# Patient Record
Sex: Female | Born: 1950
Health system: Southern US, Community
[De-identification: ages and names within clinical notes are randomized; demographics above are authoritative.]

## PROBLEM LIST (undated history)

## (undated) DIAGNOSIS — K219 Gastro-esophageal reflux disease without esophagitis: Secondary | ICD-10-CM

## (undated) DIAGNOSIS — H9319 Tinnitus, unspecified ear: Secondary | ICD-10-CM

## (undated) DIAGNOSIS — I10 Essential (primary) hypertension: Secondary | ICD-10-CM

## (undated) DIAGNOSIS — M25552 Pain in left hip: Secondary | ICD-10-CM

## (undated) DIAGNOSIS — D72819 Decreased white blood cell count, unspecified: Secondary | ICD-10-CM

## (undated) DIAGNOSIS — M48061 Spinal stenosis, lumbar region without neurogenic claudication: Secondary | ICD-10-CM

## (undated) DIAGNOSIS — E559 Vitamin D deficiency, unspecified: Secondary | ICD-10-CM

## (undated) DIAGNOSIS — M5430 Sciatica, unspecified side: Secondary | ICD-10-CM

## (undated) DIAGNOSIS — E785 Hyperlipidemia, unspecified: Secondary | ICD-10-CM

## (undated) DIAGNOSIS — Z860100 Personal history of colon polyps, unspecified: Secondary | ICD-10-CM

## (undated) DIAGNOSIS — M545 Low back pain, unspecified: Secondary | ICD-10-CM

## (undated) DIAGNOSIS — R32 Unspecified urinary incontinence: Secondary | ICD-10-CM

## (undated) DIAGNOSIS — N898 Other specified noninflammatory disorders of vagina: Secondary | ICD-10-CM

## (undated) DIAGNOSIS — M4310 Spondylolisthesis, site unspecified: Secondary | ICD-10-CM

## (undated) HISTORY — DX: Spondylolisthesis, site unspecified: M43.10

## (undated) HISTORY — DX: Pain in left hip: M25.552

## (undated) HISTORY — DX: Spinal stenosis, lumbar region without neurogenic claudication: M48.061

## (undated) HISTORY — DX: Other specified noninflammatory disorders of vagina: N89.8

## (undated) HISTORY — PX: BREAST EXCISIONAL BIOPSY: SUR124

## (undated) HISTORY — DX: Essential (primary) hypertension: I10

## (undated) HISTORY — DX: Personal history of colon polyps, unspecified: Z86.0100

## (undated) HISTORY — PX: BREAST BIOPSY: SHX20

## (undated) HISTORY — DX: Hyperlipidemia, unspecified: E78.5

## (undated) HISTORY — DX: Low back pain, unspecified: M54.50

## (undated) HISTORY — DX: Vitamin D deficiency, unspecified: E55.9

## (undated) HISTORY — DX: Tinnitus, unspecified ear: H93.19

## (undated) HISTORY — DX: Sciatica, unspecified side: M54.30

## (undated) HISTORY — DX: Gastro-esophageal reflux disease without esophagitis: K21.9

## (undated) HISTORY — DX: Decreased white blood cell count, unspecified: D72.819

## (undated) HISTORY — DX: Unspecified urinary incontinence: R32

---

## 1988-11-30 HISTORY — PX: ABDOMINAL HYSTERECTOMY: SHX81

## 2015-11-07 DIAGNOSIS — I10 Essential (primary) hypertension: Secondary | ICD-10-CM | POA: Diagnosis not present

## 2015-11-07 DIAGNOSIS — R5383 Other fatigue: Secondary | ICD-10-CM | POA: Diagnosis not present

## 2015-11-07 DIAGNOSIS — E782 Mixed hyperlipidemia: Secondary | ICD-10-CM | POA: Diagnosis not present

## 2015-12-25 DIAGNOSIS — L570 Actinic keratosis: Secondary | ICD-10-CM | POA: Diagnosis not present

## 2015-12-25 DIAGNOSIS — Z85828 Personal history of other malignant neoplasm of skin: Secondary | ICD-10-CM | POA: Diagnosis not present

## 2015-12-25 DIAGNOSIS — D485 Neoplasm of uncertain behavior of skin: Secondary | ICD-10-CM | POA: Diagnosis not present

## 2016-03-30 DIAGNOSIS — J00 Acute nasopharyngitis [common cold]: Secondary | ICD-10-CM | POA: Diagnosis not present

## 2016-05-13 DIAGNOSIS — R5383 Other fatigue: Secondary | ICD-10-CM | POA: Diagnosis not present

## 2016-05-13 DIAGNOSIS — Z79899 Other long term (current) drug therapy: Secondary | ICD-10-CM | POA: Diagnosis not present

## 2016-05-13 DIAGNOSIS — E782 Mixed hyperlipidemia: Secondary | ICD-10-CM | POA: Diagnosis not present

## 2016-05-13 DIAGNOSIS — I10 Essential (primary) hypertension: Secondary | ICD-10-CM | POA: Diagnosis not present

## 2016-05-18 DIAGNOSIS — I10 Essential (primary) hypertension: Secondary | ICD-10-CM | POA: Diagnosis not present

## 2016-05-18 DIAGNOSIS — K219 Gastro-esophageal reflux disease without esophagitis: Secondary | ICD-10-CM | POA: Diagnosis not present

## 2016-05-18 DIAGNOSIS — Z1389 Encounter for screening for other disorder: Secondary | ICD-10-CM | POA: Diagnosis not present

## 2016-05-18 DIAGNOSIS — Z79899 Other long term (current) drug therapy: Secondary | ICD-10-CM | POA: Diagnosis not present

## 2016-05-18 DIAGNOSIS — E782 Mixed hyperlipidemia: Secondary | ICD-10-CM | POA: Diagnosis not present

## 2016-05-18 DIAGNOSIS — M810 Age-related osteoporosis without current pathological fracture: Secondary | ICD-10-CM | POA: Diagnosis not present

## 2016-05-18 DIAGNOSIS — Z0000 Encounter for general adult medical examination without abnormal findings: Secondary | ICD-10-CM | POA: Diagnosis not present

## 2016-06-05 DIAGNOSIS — H1045 Other chronic allergic conjunctivitis: Secondary | ICD-10-CM | POA: Diagnosis not present

## 2016-06-19 DIAGNOSIS — K753 Granulomatous hepatitis, not elsewhere classified: Secondary | ICD-10-CM | POA: Diagnosis not present

## 2016-06-19 DIAGNOSIS — M47896 Other spondylosis, lumbar region: Secondary | ICD-10-CM | POA: Diagnosis not present

## 2016-06-19 DIAGNOSIS — M5136 Other intervertebral disc degeneration, lumbar region: Secondary | ICD-10-CM | POA: Diagnosis not present

## 2016-06-19 DIAGNOSIS — I700 Atherosclerosis of aorta: Secondary | ICD-10-CM | POA: Diagnosis not present

## 2016-06-19 DIAGNOSIS — M545 Low back pain: Secondary | ICD-10-CM | POA: Diagnosis not present

## 2016-07-08 DIAGNOSIS — Z124 Encounter for screening for malignant neoplasm of cervix: Secondary | ICD-10-CM | POA: Diagnosis not present

## 2016-07-08 DIAGNOSIS — Z01419 Encounter for gynecological examination (general) (routine) without abnormal findings: Secondary | ICD-10-CM | POA: Diagnosis not present

## 2016-07-08 DIAGNOSIS — Z79899 Other long term (current) drug therapy: Secondary | ICD-10-CM | POA: Diagnosis not present

## 2016-07-08 DIAGNOSIS — I10 Essential (primary) hypertension: Secondary | ICD-10-CM | POA: Diagnosis not present

## 2016-07-10 DIAGNOSIS — H04123 Dry eye syndrome of bilateral lacrimal glands: Secondary | ICD-10-CM | POA: Diagnosis not present

## 2016-07-10 DIAGNOSIS — Z1231 Encounter for screening mammogram for malignant neoplasm of breast: Secondary | ICD-10-CM | POA: Diagnosis not present

## 2016-07-10 DIAGNOSIS — Z780 Asymptomatic menopausal state: Secondary | ICD-10-CM | POA: Diagnosis not present

## 2016-07-10 DIAGNOSIS — M8588 Other specified disorders of bone density and structure, other site: Secondary | ICD-10-CM | POA: Diagnosis not present

## 2016-10-14 DIAGNOSIS — Z23 Encounter for immunization: Secondary | ICD-10-CM | POA: Diagnosis not present

## 2016-11-03 DIAGNOSIS — D72819 Decreased white blood cell count, unspecified: Secondary | ICD-10-CM | POA: Diagnosis not present

## 2016-11-03 DIAGNOSIS — M545 Low back pain: Secondary | ICD-10-CM | POA: Diagnosis not present

## 2016-11-03 DIAGNOSIS — I10 Essential (primary) hypertension: Secondary | ICD-10-CM | POA: Diagnosis not present

## 2016-11-03 DIAGNOSIS — K219 Gastro-esophageal reflux disease without esophagitis: Secondary | ICD-10-CM | POA: Diagnosis not present

## 2016-11-03 DIAGNOSIS — E785 Hyperlipidemia, unspecified: Secondary | ICD-10-CM | POA: Diagnosis not present

## 2016-11-17 DIAGNOSIS — D72819 Decreased white blood cell count, unspecified: Secondary | ICD-10-CM | POA: Diagnosis not present

## 2016-11-17 DIAGNOSIS — E785 Hyperlipidemia, unspecified: Secondary | ICD-10-CM | POA: Diagnosis not present

## 2017-01-29 DIAGNOSIS — E785 Hyperlipidemia, unspecified: Secondary | ICD-10-CM | POA: Diagnosis not present

## 2017-01-29 DIAGNOSIS — M5410 Radiculopathy, site unspecified: Secondary | ICD-10-CM | POA: Diagnosis not present

## 2017-01-29 DIAGNOSIS — M549 Dorsalgia, unspecified: Secondary | ICD-10-CM | POA: Diagnosis not present

## 2017-01-29 DIAGNOSIS — D72819 Decreased white blood cell count, unspecified: Secondary | ICD-10-CM | POA: Diagnosis not present

## 2017-02-01 ENCOUNTER — Other Ambulatory Visit: Payer: Self-pay | Admitting: Family Medicine

## 2017-02-01 DIAGNOSIS — M5410 Radiculopathy, site unspecified: Secondary | ICD-10-CM

## 2017-02-21 ENCOUNTER — Ambulatory Visit
Admission: RE | Admit: 2017-02-21 | Discharge: 2017-02-21 | Disposition: A | Discharge status: Home / Self Care | Payer: Medicare Other | Source: Ambulatory Visit | Attending: Family Medicine | Admitting: Family Medicine

## 2017-02-21 DIAGNOSIS — M48061 Spinal stenosis, lumbar region without neurogenic claudication: Secondary | ICD-10-CM | POA: Diagnosis not present

## 2017-02-21 DIAGNOSIS — M5410 Radiculopathy, site unspecified: Secondary | ICD-10-CM

## 2017-03-09 DIAGNOSIS — Z6826 Body mass index (BMI) 26.0-26.9, adult: Secondary | ICD-10-CM | POA: Diagnosis not present

## 2017-03-09 DIAGNOSIS — I10 Essential (primary) hypertension: Secondary | ICD-10-CM | POA: Diagnosis not present

## 2017-03-09 DIAGNOSIS — M48061 Spinal stenosis, lumbar region without neurogenic claudication: Secondary | ICD-10-CM | POA: Diagnosis not present

## 2017-03-09 DIAGNOSIS — M4317 Spondylolisthesis, lumbosacral region: Secondary | ICD-10-CM | POA: Diagnosis not present

## 2017-03-09 DIAGNOSIS — M4316 Spondylolisthesis, lumbar region: Secondary | ICD-10-CM | POA: Diagnosis not present

## 2017-05-04 DIAGNOSIS — I10 Essential (primary) hypertension: Secondary | ICD-10-CM | POA: Diagnosis not present

## 2017-05-04 DIAGNOSIS — D72819 Decreased white blood cell count, unspecified: Secondary | ICD-10-CM | POA: Diagnosis not present

## 2017-05-04 DIAGNOSIS — Z0000 Encounter for general adult medical examination without abnormal findings: Secondary | ICD-10-CM | POA: Diagnosis not present

## 2017-05-04 DIAGNOSIS — E785 Hyperlipidemia, unspecified: Secondary | ICD-10-CM | POA: Diagnosis not present

## 2017-05-04 DIAGNOSIS — Z23 Encounter for immunization: Secondary | ICD-10-CM | POA: Diagnosis not present

## 2017-05-31 DIAGNOSIS — M48061 Spinal stenosis, lumbar region without neurogenic claudication: Secondary | ICD-10-CM | POA: Diagnosis not present

## 2017-05-31 DIAGNOSIS — M4316 Spondylolisthesis, lumbar region: Secondary | ICD-10-CM | POA: Diagnosis not present

## 2017-06-22 ENCOUNTER — Encounter: Payer: Self-pay | Admitting: Physical Therapy

## 2017-06-22 ENCOUNTER — Ambulatory Visit: Payer: Medicare Other | Attending: Family Medicine | Admitting: Physical Therapy

## 2017-06-22 DIAGNOSIS — M5442 Lumbago with sciatica, left side: Secondary | ICD-10-CM | POA: Diagnosis not present

## 2017-06-22 DIAGNOSIS — G8929 Other chronic pain: Secondary | ICD-10-CM | POA: Diagnosis not present

## 2017-06-22 DIAGNOSIS — M6281 Muscle weakness (generalized): Secondary | ICD-10-CM | POA: Insufficient documentation

## 2017-06-22 NOTE — Patient Instructions (Addendum)
Chair Sitting    Sit at edge of seat, spine straight, one leg extended. Put a hand on each thigh and bend forward from the hip, keeping spine straight. Allow hand on extended leg to reach toward toes. Support upper body with other arm. Hold _30__ seconds. Repeat _2__ times per session. Do _1__ sessions per day.  Copyright  VHI. All rights reserved.  Piriformis Stretch, Sitting    Sit, one ankle on opposite knee, same-side hand on crossed knee. Push down on knee, keeping spine straight. Lean torso forward, with flat back, until tension is felt in hamstrings and gluteals of crossed-leg side. Hold _30__ seconds.  Repeat _2__ times per session. Do _1__ sessions per day.  Copyright  VHI. All rights reserved.  Posture - Sitting    Sit upright, head facing forward. Try using a roll to support lower back. Keep shoulders relaxed, and avoid rounded back. Keep hips level with knees. Avoid crossing legs for long periods.   Copyright  VHI. All rights reserved.    Posture - Standing    Good posture is important. Avoid slouching and forward head thrust. Maintain curve in low back and align ears over shoul- ders, hips over ankles.   Copyright  VHI. All rights reserved.   Housework - Vacuuming    Hold the vacuum with arm held at side. Step back and forth to move it, keeping head up. Avoid twisting.   Copyright  VHI. All rights reserved.  [place] [address] [place],  [address] Phone # [phone] Fax [phone]

## 2017-06-22 NOTE — Therapy (Signed)
[facility] [address] [place], [place], [address] Phone: [phone]   Fax:  [phone]  Physical Therapy Evaluation  Patient Details  Name: Lauren Martinez MRN: [id number] Date of Birth: 10-27-1951 Referring Provider: Dr. London Pepper  Encounter Date: 06/22/2017      PT End of Session - 06/22/17 0842    Visit Number 1   Number of Visits 10   Date for PT Re-Evaluation 08/17/17   Authorization Type medicare g-code 10th visit   PT Start Time 0800   PT Stop Time 0843   PT Time Calculation (min) 43 min   Activity Tolerance Patient tolerated treatment well   Behavior During Therapy [facility] for tasks assessed/performed      Past Medical History:  Diagnosis Date  . GERD (gastroesophageal reflux disease)   . Hyperlipidemia   . Hypertension   . Spinal stenosis at L4-L5 level   . Spondylisthesis     Past Surgical History:  Procedure Laterality Date  . ABDOMINAL HYSTERECTOMY  1990    There were no vitals filed for this visit.       Subjective Assessment - 06/22/17 0817    Subjective Patient reports she retired 05/2015.  Patient was packing to move and started to have back pain.  Pain would not get better. Back pain has become chronic and gradually become worse.    Limitations Standing;Walking   Patient Stated Goals be painfree or less pain with activitiy   Currently in Pain? Yes   Pain Score 4   worse is 10/10   Pain Location Back  to left hip   Pain Orientation Left   Pain Descriptors / Indicators Aching;Dull;Shooting;Sharp   Pain Type Chronic pain   Pain Radiating Towards to left hip   Pain Onset More than a month ago   Pain Frequency Intermittent   Aggravating Factors  walking,standing, vacuuming   Pain Relieving Factors sitting            [facility] PT Assessment - 06/22/17 0001      Assessment   Medical Diagnosis M43.10 spondylisthesis; M48.061 spinal stenosis at l4-l5 level   Referring Provider  Dr. London Pepper   Onset Date/Surgical Date 11/30/14   Prior Therapy none     Precautions   Precautions None     Restrictions   Weight Bearing Restrictions No     Balance Screen   Has the patient fallen in the past 6 months No   Has the patient had a decrease in activity level because of a fear of falling?  No   Is the patient reluctant to leave their home because of a fear of falling?  No     Home Ec[employer] residence     Prior Function   Level of Independence Independent   Vocation Retired   Leisure walk, gym 3x/week,      Cognition   Overall Cognitive Status Within Functional Limits for tasks assessed     Observation/Other Assessments   Focus on Therapeutic Outcomes (FOTO)  30% limitation  goal is 27% limitation     Posture/Postural Control   Posture/Postural Control No significant limitations     ROM / Strength   AROM / PROM / Strength AROM;PROM;Strength     AROM   Overall AROM Comments lumbar ROM is full     PROM   Right Hip External Rotation  45   Left Hip External Rotation  45  Strength   Right Hip Flexion 3+/5   Right Hip External Rotation  4/5   Right Hip ABduction 4/5   Left Hip Flexion 3+/5   Left Hip External Rotation 4/5   Left Hip ABduction 4/5     Palpation   Palpation comment tightness located in left lumbar paraspinals     Transfers   Transfers Not assessed     Ambulation/Gait   Ambulation/Gait No            Objective measurements completed on examination: See above findings.                    PT Short Term Goals - 06/22/17 0843      PT SHORT TERM GOAL #1   Title independent with initial HEP   Time 4   Period Weeks   Status New   Target Date 07/20/17     PT SHORT TERM GOAL #2   Title demonstrate correct body mechanics with daily activities, lifting, standing, and sitting   Time 4   Period Weeks   Status New   Target Date 07/20/17           PT Long Term Goals -  06/22/17 0838      PT LONG TERM GOAL #1   Title independent with HEP   Time 8   Period Weeks   Status New   Target Date 08/17/17     PT LONG TERM GOAL #2   Title vacuum with correct body mechanics or other types of housework in standing with minimal to no pain   Time 8   Period Weeks   Status New   Target Date 08/17/17     PT LONG TERM GOAL #3   Title walking for for 2 or more miles with minimal to no pain due to increased core and hip strength   Time 8   Period Weeks   Status New   Target Date 08/17/17     PT LONG TERM GOAL #4   Title sitting in a car for 1.5 hours with minimal to no pain with good back support   Time 8   Period Weeks   Status New   Target Date 08/17/17     PT LONG TERM GOAL #5   Title FOTO score </= 27% limitation   Time 8   Period Weeks   Status New   Target Date 08/17/17                Plan - 06/22/17 0951    Clinical Impression Statement Patient is a 66 year old female with chronic back pain when she started to pain her home 11/2014.  Patient reports her pain has become worse over time and is intermittent.  Pain in low back to right hip is 4-10/10.  Patient has full lumbar ROM.  Decreased bilateral hip ER PROM.  Decreased core and bilateral hip strength.  Patient has increase pain with standing, walking, standing household chores.  Patient will benefit from skilled therapy to reduce back pain and improve strength to return to prior functional level.    History and Personal Factors relevant to plan of care: spondylisthesis; spinal stenosis at L4-L5 level   Clinical Presentation Stable   Clinical Presentation due to: stable condition   Clinical Decision Making Low   Rehab Potential Excellent   PT Frequency 2x / week   PT Duration 8 weeks   PT Treatment/Interventions Cryotherapy;Electrical Stimulation;Moist Heat;Traction;Ultrasound;Patient/family education;Neuromuscular re-education;Therapeutic exercise;Therapeutic activities;Manual  techniques;Passive range of motion;Dry needling;Taping   PT Next Visit Plan core strength; Hip ER ROM; hip strength; body mechanics   PT Home Exercise Plan body mechanics   Consulted and Agree with Plan of Care Patient      Patient will benefit from skilled therapeutic intervention in order to improve the following deficits and impairments:  Increased fascial restricitons, Decreased range of motion, Increased muscle spasms, Pain, Decreased activity tolerance, Decreased mobility, Decreased strength, Impaired flexibility  Visit Diagnosis: Chronic left-sided low back pain with left-sided sciatica - Plan: PT plan of care cert/re-cert  Muscle weakness (generalized) - Plan: PT plan of care cert/re-cert      G-Codes - 96/78/93 0845    Functional Assessment Tool Used (Outpatient Only) foto score is 30% limitation  goal is 27% limitation   Functional Limitation Mobility: Walking and moving around   Mobility: Walking and Moving Around Current Status (Y1017) At least 20 percent but less than 40 percent impaired, limited or restricted   Mobility: Walking and Moving Around Goal Status (P1025) At least 20 percent but less than 40 percent impaired, limited or restricted       Problem List There are no active problems to display for this patient.   Earlie Counts, PT 06/22/17 9:58 AM    [facility] [address] [place], [place], [address] Phone: [phone]   Fax:  [phone]  Name: Lauren Martinez MRN: [id number] Date of Birth: 14-Jun-1951

## 2017-06-28 ENCOUNTER — Encounter: Payer: Self-pay | Admitting: Physical Therapy

## 2017-06-28 ENCOUNTER — Ambulatory Visit: Payer: Medicare Other | Admitting: Physical Therapy

## 2017-06-28 DIAGNOSIS — G8929 Other chronic pain: Secondary | ICD-10-CM | POA: Diagnosis not present

## 2017-06-28 DIAGNOSIS — M5442 Lumbago with sciatica, left side: Principal | ICD-10-CM

## 2017-06-28 DIAGNOSIS — M6281 Muscle weakness (generalized): Secondary | ICD-10-CM | POA: Diagnosis not present

## 2017-06-28 NOTE — Therapy (Signed)
[facility] [address], [place], [place], [address] Phone: [phone]   Fax:  [phone]  Physical Therapy Treatment  Patient Details  Name: Lauren Martinez MRN: [id number] Date of Birth: Nov 19, 1951 Referring Provider: Dr. London Pepper  Encounter Date: 06/28/2017      PT End of Session - 06/28/17 1624    Visit Number 2   Number of Visits 10   Date for PT Re-Evaluation 08/17/17   Authorization Type medicare g-code 10th visit   PT Start Time 1616   PT Stop Time 1658   PT Time Calculation (min) 42 min   Activity Tolerance Patient tolerated treatment well   Behavior During Therapy [facility] for tasks assessed/performed      Past Medical History:  Diagnosis Date  . GERD (gastroesophageal reflux disease)   . Hyperlipidemia   . Hypertension   . Spinal stenosis at L4-L5 level   . Spondylisthesis     Past Surgical History:  Procedure Laterality Date  . ABDOMINAL HYSTERECTOMY  1990    There were no vitals filed for this visit.      Subjective Assessment - 06/28/17 1622    Subjective Today I have felt pretty good.  I rode the bike at the gym.  I didn't do a lot over the weekend.  If I walk a lot or on my feet a lot, it hurts worse.   Limitations Standing;Walking   Patient Stated Goals be painfree or less pain with activitiy   Currently in Pain? Yes   Pain Score 2    Pain Location Back   Pain Orientation Left   Pain Descriptors / Indicators Aching;Dull   Pain Type Chronic pain   Pain Radiating Towards to the hip and right side of the back   Pain Onset More than a month ago   Pain Frequency Intermittent   Aggravating Factors  walking, standing   Pain Relieving Factors sitting   Multiple Pain Sites No                         [facility] Adult PT Treatment/Exercise - 06/28/17 0001      Exercises   Exercises Lumbar     Lumbar Exercises: Stretches   Single Knee to Chest Stretch 3 reps;20 seconds    Pelvic Tilt 5 reps;10 seconds   Piriformis Stretch 3 reps;20 seconds   Piriformis Stretch Limitations IR hip stretch in supine - 3 x 20 sec     Lumbar Exercises: Aerobic   Stationary Bike Nu-step L1; 6 min     Lumbar Exercises: Supine   Ab Set 5 reps;5 seconds   Bent Knee Raise 10 reps   Bridge 15 reps   Bridge Limitations mini bridge with pelvic tilt   Large Ball Abdominal Isometric 10 reps  red ball roll out and back                PT Education - 06/28/17 1658    Education provided Yes   Education Details pelvic tilt, marching, clam   Person(s) Educated Patient   Methods Explanation;Demonstration;Handout   Comprehension Verbalized understanding;Returned demonstration          PT Short Term Goals - 06/28/17 1706      PT SHORT TERM GOAL #1   Title independent with initial HEP   Time 4   Period Weeks   Status On-going     PT SHORT TERM GOAL #2   Title demonstrate  correct body mechanics with daily activities, lifting, standing, and sitting   Time 4   Period Weeks   Status On-going           PT Long Term Goals - 06/22/17 1856      PT LONG TERM GOAL #1   Title independent with HEP   Time 8   Period Weeks   Status New   Target Date 08/17/17     PT LONG TERM GOAL #2   Title vacuum with correct body mechanics or other types of housework in standing with minimal to no pain   Time 8   Period Weeks   Status New   Target Date 08/17/17     PT LONG TERM GOAL #3   Title walking for for 2 or more miles with minimal to no pain due to increased core and hip strength   Time 8   Period Weeks   Status New   Target Date 08/17/17     PT LONG TERM GOAL #4   Title sitting in a car for 1.5 hours with minimal to no pain with good back support   Time 8   Period Weeks   Status New   Target Date 08/17/17     PT LONG TERM GOAL #5   Title FOTO score </= 27% limitation   Time 8   Period Weeks   Status New   Target Date 08/17/17                Plan - 06/28/17 1626    Clinical Impression Statement Pt states she is conscious of her posture and understands the posture education.  Pt able begin core strengthening today.  She has difficulty not holding her breath during the exercises.  She tolerated exercises well without increased pain.   PT Treatment/Interventions Cryotherapy;Electrical Stimulation;Moist Heat;Traction;Ultrasound;Patient/family education;Neuromuscular re-education;Therapeutic exercise;Therapeutic activities;Manual techniques;Passive range of motion;Dry needling;Taping   PT Next Visit Plan core strength; Hip ER ROM; hip strength; body mechanics   Consulted and Agree with Plan of Care Patient      Patient will benefit from skilled therapeutic intervention in order to improve the following deficits and impairments:  Increased fascial restricitons, Decreased range of motion, Increased muscle spasms, Pain, Decreased activity tolerance, Decreased mobility, Decreased strength, Impaired flexibility  Visit Diagnosis: Chronic left-sided low back pain with left-sided sciatica  Muscle weakness (generalized)     Problem List There are no active problems to display for this patient.   Zannie Cove, PT 06/28/2017, 5:12 PM  [facility] [address], [place], [place], [address] Phone: [phone]   Fax:  [phone]  Name: Lauren Martinez MRN: [id number] Date of Birth: 04-27-1951

## 2017-06-28 NOTE — Patient Instructions (Signed)
   PELVIC TILT - SUPINE  Lie on your back with your knees bent. Next, arch your low back and then flatten it repeatedly. Your pelvis should tilt forward and back during the movement. Move through a comfortable range of motion. 20x      Transverse Abdominus Activation  Contract your lower abdominals as if you were trying to lift one leg from the table.  Initiate the movement but do no lift foot greater than 1 inch from the table.  Repeat opposite side.  20x each side    CLAM SHELLS  While lying on your side with your knees bent, draw up the top knee while keeping contact of your feet together.  Do not let your pelvis roll back during the lifting movement.  Repeat 20x

## 2017-06-30 ENCOUNTER — Ambulatory Visit: Payer: Medicare Other | Attending: Family Medicine

## 2017-06-30 DIAGNOSIS — M6281 Muscle weakness (generalized): Secondary | ICD-10-CM | POA: Insufficient documentation

## 2017-06-30 DIAGNOSIS — M5136 Other intervertebral disc degeneration, lumbar region: Secondary | ICD-10-CM | POA: Diagnosis not present

## 2017-06-30 DIAGNOSIS — M5442 Lumbago with sciatica, left side: Secondary | ICD-10-CM | POA: Insufficient documentation

## 2017-06-30 DIAGNOSIS — M47817 Spondylosis without myelopathy or radiculopathy, lumbosacral region: Secondary | ICD-10-CM | POA: Diagnosis not present

## 2017-06-30 DIAGNOSIS — G8929 Other chronic pain: Secondary | ICD-10-CM | POA: Insufficient documentation

## 2017-06-30 DIAGNOSIS — M79652 Pain in left thigh: Secondary | ICD-10-CM | POA: Diagnosis not present

## 2017-06-30 DIAGNOSIS — M4696 Unspecified inflammatory spondylopathy, lumbar region: Secondary | ICD-10-CM | POA: Diagnosis not present

## 2017-06-30 NOTE — Therapy (Signed)
[facility] [address] [place], [place], [address] Phone: [phone]   Fax:  [phone]  Physical Therapy Treatment  Patient Details  Name: Cozetta Seif MRN: [id number] Date of Birth: 1951/09/09 Referring Provider: Dr. London Pepper  Encounter Date: 06/30/2017      PT End of Session - 06/30/17 1654    Visit Number 3   Number of Visits 10   Date for PT Re-Evaluation 08/17/17   Authorization Type medicare g-code 10th visit   PT Start Time 1613   PT Stop Time 1654   PT Time Calculation (min) 41 min   Activity Tolerance Patient tolerated treatment well   Behavior During Therapy [facility] for tasks assessed/performed      Past Medical History:  Diagnosis Date  . GERD (gastroesophageal reflux disease)   . Hyperlipidemia   . Hypertension   . Spinal stenosis at L4-L5 level   . Spondylisthesis     Past Surgical History:  Procedure Laterality Date  . ABDOMINAL HYSTERECTOMY  1990    There were no vitals filed for this visit.      Subjective Assessment - 06/30/17 1617    Subjective Pt reports some lumbar soreness after being busy today.     Currently in Pain? Yes   Pain Score 3    Pain Location Back   Pain Orientation Left   Pain Descriptors / Indicators Aching;Dull   Pain Type Chronic pain                         [facility] Adult PT Treatment/Exercise - 06/30/17 0001      Exercises   Exercises Knee/Hip     Lumbar Exercises: Stretches   Active Hamstring Stretch 3 reps;20 seconds   Single Knee to Chest Stretch 3 reps;20 seconds   Pelvic Tilt 5 reps;10 seconds   Piriformis Stretch 3 reps;20 seconds   Piriformis Stretch Limitations IR hip stretch in supine - 3 x 20 sec     Lumbar Exercises: Aerobic   Stationary Bike Nu-step L2x  6 min     Lumbar Exercises: Supine   Ab Set 5 reps;5 seconds   Bridge 15 reps   Bridge Limitations mini bridge with pelvic tilt     Lumbar Exercises: Sidelying    Clam 20 reps     Knee/Hip Exercises: Stretches   Hip Flexor Stretch 3 reps;20 seconds                PT Education - 06/30/17 1632    Education provided Yes   Education Details body mechanics   Person(s) Educated Patient   Methods Explanation;Handout;Demonstration   Comprehension Verbalized understanding;Returned demonstration          PT Short Term Goals - 06/30/17 1618      PT SHORT TERM GOAL #1   Title independent with initial HEP   Status Achieved     PT SHORT TERM GOAL #2   Title demonstrate correct body mechanics with daily activities, lifting, standing, and sitting   Time 4   Period Weeks   Status On-going           PT Long Term Goals - 06/30/17 1619      PT LONG TERM GOAL #1   Title independent with HEP   Time 8   Period Weeks   Status On-going               Plan - 06/30/17 1623  Clinical Impression Statement Pt is independent and compliant in HEP for flexibility and strength.  No significant change in pain since the start of care.  Pt able to perform abdominal bracing without holding her breath today.  Pt without increased pain today with exercise.  Pt will continue to benefit from skilled PT for core strength, endurance and flexibility.     Rehab Potential Excellent   PT Frequency 2x / week   PT Duration 8 weeks   PT Treatment/Interventions Cryotherapy;Electrical Stimulation;Moist Heat;Traction;Ultrasound;Patient/family education;Neuromuscular re-education;Therapeutic exercise;Therapeutic activities;Manual techniques;Passive range of motion;Dry needling;Taping   PT Next Visit Plan core strength; Hip ER ROM; hip strength   Consulted and Agree with Plan of Care Patient      Patient will benefit from skilled therapeutic intervention in order to improve the following deficits and impairments:  Increased fascial restricitons, Decreased range of motion, Increased muscle spasms, Pain, Decreased activity tolerance, Decreased mobility,  Decreased strength, Impaired flexibility  Visit Diagnosis: Chronic left-sided low back pain with left-sided sciatica  Muscle weakness (generalized)     Problem List There are no active problems to display for this patient.   Sigurd Sos, PT 06/30/17 4:55 PM  [facility] [address], [place], [place], [address] Phone: [phone]   Fax:  [phone]  Name: Lanie Schelling MRN: [id number] Date of Birth: 24-Jul-1951

## 2017-06-30 NOTE — Patient Instructions (Addendum)
   Lifting Principles  Maintain proper posture and head alignment. Slide object as close as possible before lifting. Move obstacles out of the way. Test before lifting; ask for help if too heavy. Tighten stomach muscles without holding breath. Use smooth movements; do not jerk. Use legs to do the work, and pivot with feet. Distribute the work load symmetrically and close to the center of trunk. Push instead of pull whenever possible.   Squat down and hold basket close to stand. Use leg muscles to do the work.    Avoid twisting or bending back. Pivot around using foot movements, and bend at knees if needed when reaching for articles.        Getting Into / Out of Bed   Lower self to lie down on one side by raising legs and lowering head at the same time. Use arms to assist moving without twisting. Bend both knees to roll onto back if desired. To sit up, start from lying on side, and use same move-ments in reverse. Keep trunk aligned with legs.    Shift weight from front foot to back foot as item is lifted off shelf.    When leaning forward to pick object up from floor, extend one leg out behind. Keep back straight. Hold onto a sturdy support with other hand.      Sit upright, head facing forward. Try using a roll to support lower back. Keep shoulders relaxed, and avoid rounded back. Keep hips level with knees. Avoid crossing legs for long periods.     [facility] [address] [place], [place] [address] Phone # [phone] Fax [phone]  

## 2017-07-06 ENCOUNTER — Ambulatory Visit: Payer: Medicare Other | Admitting: Rehabilitation

## 2017-07-06 ENCOUNTER — Encounter: Payer: Self-pay | Admitting: Rehabilitation

## 2017-07-06 DIAGNOSIS — M5442 Lumbago with sciatica, left side: Secondary | ICD-10-CM | POA: Diagnosis not present

## 2017-07-06 DIAGNOSIS — G8929 Other chronic pain: Secondary | ICD-10-CM

## 2017-07-06 DIAGNOSIS — M6281 Muscle weakness (generalized): Secondary | ICD-10-CM | POA: Diagnosis not present

## 2017-07-06 NOTE — Therapy (Signed)
[facility] [address] [place], [place], [address] Phone: [phone]   Fax:  [phone]  Physical Therapy Treatment  Patient Details  Name: Lauren Martinez MRN: [id number] Date of Birth: 28-Sep-1951 Referring Provider: Dr. London Pepper  Encounter Date: 07/06/2017      PT End of Session - 07/06/17 1047    Visit Number 4   Number of Visits 10   Date for PT Re-Evaluation 08/17/17   Authorization Type medicare g-code 10th visit   PT Start Time 1015   PT Stop Time 1100   PT Time Calculation (min) 45 min   Activity Tolerance Patient tolerated treatment well      Past Medical History:  Diagnosis Date  . GERD (gastroesophageal reflux disease)   . Hyperlipidemia   . Hypertension   . Spinal stenosis at L4-L5 level   . Spondylisthesis     Past Surgical History:  Procedure Laterality Date  . ABDOMINAL HYSTERECTOMY  1990    There were no vitals filed for this visit.      Subjective Assessment - 07/06/17 1016    Subjective no better and no worse. Went to the pain management MD and will be starting lumbar injection tomorrow    Diagnostic tests "pinching of the 4th and 5th nerves in the lumbar spine due to OA" per pt   Currently in Pain? Yes   Pain Score 3    Pain Location Back   Pain Orientation Left   Pain Descriptors / Indicators Aching;Dull            [facility] PT Assessment - 07/06/17 0001      AROM   Overall AROM Comments lumbar AROM WNL today but increased pain with extension                     [facility] Adult PT Treatment/Exercise - 07/06/17 0001      Lumbar Exercises: Stretches   Passive Hamstring Stretch 3 reps;20 seconds   Passive Hamstring Stretch Limitations with green strap   Pelvic Tilt 5 reps;10 seconds   Piriformis Stretch 3 reps;20 seconds  thread the needle position     Lumbar Exercises: Aerobic   Stationary Bike level 2x50min     Lumbar Exercises: Supine   Ab Set 5  reps  10seconds   AB Set Limitations trying various cueing without palpable contraction   Bridge 15 reps   Bridge Limitations mini bridge with pelvic tilt   Large Ball Abdominal Isometric 10 reps   Large Ball Abdominal Isometric Limitations alternating LE lifts x 10 bil                  PT Short Term Goals - 07/06/17 1051      PT SHORT TERM GOAL #1   Title independent with initial HEP   Status Achieved           PT Long Term Goals - 06/30/17 1619      PT LONG TERM GOAL #1   Title independent with HEP   Time 8   Period Weeks   Status On-going               Plan - 07/06/17 1047    Clinical Impression Statement Pt continues to report no change in pain.  Will be starting spinal injections tomorrow and will call if needing to cancel thursday's appointment.  Tolerated all well but did not feel abdominal bracing even with multiple cueing tried.  After cueing about not using diaphragm for movement patient was able to initiate contraction   PT Treatment/Interventions Cryotherapy;Electrical Stimulation;Moist Heat;Traction;Ultrasound;Patient/family education;Neuromuscular re-education;Therapeutic exercise;Therapeutic activities;Manual techniques;Passive range of motion;Dry needling;Taping   PT Next Visit Plan core strength; Hip ER ROM; hip strength   Consulted and Agree with Plan of Care Patient      Patient will benefit from skilled therapeutic intervention in order to improve the following deficits and impairments:  Increased fascial restricitons, Decreased range of motion, Increased muscle spasms, Pain, Decreased activity tolerance, Decreased mobility, Decreased strength, Impaired flexibility  Visit Diagnosis: Chronic left-sided low back pain with left-sided sciatica  Muscle weakness (generalized)     Problem List There are no active problems to display for this patient.   Stark Bray, DPT, CMP 07/06/2017, 11:00 AM  [facility] [address], [place], [place], [address] Phone: [phone]   Fax:  [phone]  Name: Lauren Martinez MRN: [id number] Date of Birth: 07/08/1951

## 2017-07-07 DIAGNOSIS — M47817 Spondylosis without myelopathy or radiculopathy, lumbosacral region: Secondary | ICD-10-CM | POA: Diagnosis not present

## 2017-07-08 ENCOUNTER — Encounter: Payer: Self-pay | Admitting: Physical Therapy

## 2017-07-08 ENCOUNTER — Ambulatory Visit: Payer: Medicare Other | Admitting: Physical Therapy

## 2017-07-08 DIAGNOSIS — M6281 Muscle weakness (generalized): Secondary | ICD-10-CM | POA: Diagnosis not present

## 2017-07-08 DIAGNOSIS — M5442 Lumbago with sciatica, left side: Secondary | ICD-10-CM | POA: Diagnosis not present

## 2017-07-08 DIAGNOSIS — G8929 Other chronic pain: Secondary | ICD-10-CM | POA: Diagnosis not present

## 2017-07-08 NOTE — Therapy (Addendum)
[facility] [address], [place], [place], [address] Phone: [phone]   Fax:  [phone]  Physical Therapy Treatment  Patient Details  Name: Alphonsine Minium MRN: [id number] Date of Birth: 1951/05/11 Referring Provider: Dr. London Pepper  Encounter Date: 07/08/2017      PT End of Session - 07/08/17 1007    Visit Number 5   Number of Visits 10   Date for PT Re-Evaluation 08/17/17   Authorization Type medicare g-code 10th visit   PT Start Time 1007   PT Stop Time 1050   PT Time Calculation (min) 43 min   Activity Tolerance Patient tolerated treatment well      Past Medical History:  Diagnosis Date  . GERD (gastroesophageal reflux disease)   . Hyperlipidemia   . Hypertension   . Spinal stenosis at L4-L5 level   . Spondylisthesis     Past Surgical History:  Procedure Laterality Date  . ABDOMINAL HYSTERECTOMY  1990    There were no vitals filed for this visit.      Subjective Assessment - 07/08/17 1012    Subjective I had a lot of discomfort yesterday got an injection.  Today it is better.  My right hip and back is sore still.  The left hip pain is better though.   Limitations Standing;Walking   Diagnostic tests "pinching of the 4th and 5th nerves in the lumbar spine due to OA" per pt   Patient Stated Goals be painfree or less pain with activitiy   Currently in Pain? Yes   Pain Score 3    Pain Location Back   Pain Orientation Mid;Lower   Pain Descriptors / Indicators Aching   Pain Type Chronic pain   Pain Radiating Towards into the hips but more in the center of the back   Pain Onset More than a month ago   Pain Frequency Intermittent   Aggravating Factors  walking, standing   Pain Relieving Factors nothing   Multiple Pain Sites No                         [facility] Adult PT Treatment/Exercise - 07/08/17 0001      Neuro Re-ed    Neuro Re-ed Details  breathing: edu on balloon  breathing with core contraction and during exercises     Lumbar Exercises: Stretches   Passive Hamstring Stretch 3 reps;20 seconds   Passive Hamstring Stretch Limitations with green strap   Piriformis Stretch 3 reps;20 seconds  thread the needle position     Lumbar Exercises: Aerobic   Stationary Bike level 2x86min  cues for TrA contraction throughout     Lumbar Exercises: Seated   Sit to Stand Limitations sitting with press down for TrA engaged     Lumbar Exercises: Supine   Ab Set 10 reps   AB Set Limitations UE press down on red ball and cues to exhale   Bridge 15 reps   Bridge Limitations mini bridge with pelvic tilt   Large Ball Abdominal Isometric 20 reps  holding red ball UE flexion with TrA contract   Large Ball Abdominal Isometric Limitations alternating LE lifts x 10 bil     Lumbar Exercises: Sidelying   Clam 20 reps                PT Education - 07/08/17 1051    Education provided Yes   Education Details balloon breathing   Person(s) Educated Patient  Methods Explanation;Demonstration;Tactile cues;Verbal cues;Handout   Comprehension Verbalized understanding;Returned demonstration          PT Short Term Goals - 07/06/17 1051      PT SHORT TERM GOAL #1   Title independent with initial HEP   Status Achieved           PT Long Term Goals - 06/30/17 1619      PT LONG TERM GOAL #1   Title independent with HEP   Time 8   Period Weeks   Status On-going               Plan - 07/08/17 1008    Clinical Impression Statement Pt continues to have difficulty engaging abdominals.  She shows a little improvement since injections yesterday.  Tolerated all exercises well today with a lot of verbal and tactile cues.  Pt continues to benefit from skilled PT for improved posture and strength.   Rehab Potential Excellent   PT Treatment/Interventions Cryotherapy;Electrical Stimulation;Moist Heat;Traction;Ultrasound;Patient/family education;Neuromuscular  re-education;Therapeutic exercise;Therapeutic activities;Manual techniques;Passive range of motion;Dry needling;Taping   PT Next Visit Plan core strength; Hip ER ROM; hip strength   Consulted and Agree with Plan of Care Patient      Patient will benefit from skilled therapeutic intervention in order to improve the following deficits and impairments:  Increased fascial restricitons, Decreased range of motion, Increased muscle spasms, Pain, Decreased activity tolerance, Decreased mobility, Decreased strength, Impaired flexibility  Visit Diagnosis: Chronic left-sided low back pain with left-sided sciatica  Muscle weakness (generalized)     Problem List There are no active problems to display for this patient.   Zannie Cove, PT 07/08/2017, 11:48 AM  [facility] [address] [place], [place], [address] Phone: [phone]   Fax:  [phone]  Name: Jolly Carlini MRN: [id number] Date of Birth: 1951-08-08

## 2017-07-08 NOTE — Therapy (Signed)
[facility] [address], [place], [place], [address] Phone: [phone]   Fax:  [phone]  Physical Therapy Treatment  Patient Details  Name: Lauren Martinez MRN: [id number] Date of Birth: 18-Oct-1951 Referring Provider: Dr. London Pepper  Encounter Date: 07/08/2017      PT End of Session - 07/08/17 1007    Visit Number 5   Number of Visits 10   Date for PT Re-Evaluation 08/17/17   Authorization Type medicare g-code 10th visit   PT Start Time 1007   PT Stop Time 1050   PT Time Calculation (min) 43 min   Activity Tolerance Patient tolerated treatment well      Past Medical History:  Diagnosis Date  . GERD (gastroesophageal reflux disease)   . Hyperlipidemia   . Hypertension   . Spinal stenosis at L4-L5 level   . Spondylisthesis     Past Surgical History:  Procedure Laterality Date  . ABDOMINAL HYSTERECTOMY  1990    There were no vitals filed for this visit.      Subjective Assessment - 07/08/17 1012    Subjective I had a lot of discomfort yesterday got an injection.  Today it is better.  My right hip and back is sore still.  The left hip pain is better though.   Limitations Standing;Walking   Diagnostic tests "pinching of the 4th and 5th nerves in the lumbar spine due to OA" per pt   Patient Stated Goals be painfree or less pain with activitiy   Currently in Pain? Yes   Pain Score 3    Pain Location Back   Pain Orientation Mid;Lower   Pain Descriptors / Indicators Aching   Pain Type Chronic pain   Pain Radiating Towards into the hips but more in the center of the back   Pain Onset More than a month ago   Pain Frequency Intermittent   Aggravating Factors  walking, standing   Pain Relieving Factors nothing   Multiple Pain Sites No                         [facility] Adult PT Treatment/Exercise - 07/08/17 0001      Neuro Re-ed    Neuro Re-ed Details  breathing: edu on balloon  breathing with core contraction and during exercises     Lumbar Exercises: Stretches   Passive Hamstring Stretch 3 reps;20 seconds   Passive Hamstring Stretch Limitations with green strap   Piriformis Stretch 3 reps;20 seconds  thread the needle position     Lumbar Exercises: Aerobic   Stationary Bike level 2x62min  cues for TrA contraction throughout     Lumbar Exercises: Supine   Ab Set 10 reps   AB Set Limitations UE press down on red ball and cues to exhale   Bridge 15 reps   Bridge Limitations mini bridge with pelvic tilt   Large Ball Abdominal Isometric 20 reps  holding red ball UE flexion with TrA contract   Large Ball Abdominal Isometric Limitations alternating LE lifts x 10 bil     Lumbar Exercises: Sidelying   Clam 20 reps                PT Education - 07/08/17 1051    Education provided Yes   Education Details balloon breathing   Person(s) Educated Patient   Methods Explanation;Demonstration;Tactile cues;Verbal cues;Handout   Comprehension Verbalized understanding;Returned demonstration  PT Short Term Goals - 07/06/17 1051      PT SHORT TERM GOAL #1   Title independent with initial HEP   Status Achieved           PT Long Term Goals - 06/30/17 1619      PT LONG TERM GOAL #1   Title independent with HEP   Time 8   Period Weeks   Status On-going               Plan - 07/08/17 1008    Clinical Impression Statement Pt continues to have difficulty engaging abdominals.  She shows a little improvement since injections yesterday.  Tolerated all exercises well today with a lot of verbal and tactile cues.  Pt continues to benefit from skilled PT for improved posture and strength.   Rehab Potential Excellent   PT Treatment/Interventions Cryotherapy;Electrical Stimulation;Moist Heat;Traction;Ultrasound;Patient/family education;Neuromuscular re-education;Therapeutic exercise;Therapeutic activities;Manual techniques;Passive range of  motion;Dry needling;Taping   PT Next Visit Plan core strength; Hip ER ROM; hip strength   Consulted and Agree with Plan of Care Patient      Patient will benefit from skilled therapeutic intervention in order to improve the following deficits and impairments:  Increased fascial restricitons, Decreased range of motion, Increased muscle spasms, Pain, Decreased activity tolerance, Decreased mobility, Decreased strength, Impaired flexibility  Visit Diagnosis: Chronic left-sided low back pain with left-sided sciatica  Muscle weakness (generalized)     Problem List There are no active problems to display for this patient.   Zannie Cove, PT 07/08/2017, 10:51 AM  [facility] [address], [place], [place], [address] Phone: [phone]   Fax:  [phone]  Name: Lauren Martinez MRN: [id number] Date of Birth: 1951/10/18

## 2017-07-08 NOTE — Patient Instructions (Signed)
Balloon Breath    Place hands LIGHTLY on belly below navel. Imagine a balloon inside belly and ribcage. Blow up balloon on breath IN, deflate balloon on breath OUT. Try to keep chest relaxed and don't elevate shoulders.  Contract abdominals slightly to assist breath OUT. Time _3__ minutes.  Copyright  VHI. All rights reserved.

## 2017-07-12 ENCOUNTER — Ambulatory Visit: Payer: Medicare Other

## 2017-07-12 DIAGNOSIS — M6281 Muscle weakness (generalized): Secondary | ICD-10-CM

## 2017-07-12 DIAGNOSIS — M5442 Lumbago with sciatica, left side: Secondary | ICD-10-CM | POA: Diagnosis not present

## 2017-07-12 DIAGNOSIS — G8929 Other chronic pain: Secondary | ICD-10-CM

## 2017-07-12 NOTE — Therapy (Signed)
[facility] [address] [place], [place], [address] Phone: [phone]   Fax:  [phone]  Physical Therapy Treatment  Patient Details  Name: Lauren Martinez MRN: [id number] Date of Birth: 1951/02/13 Referring Provider: Dr. London Pepper  Encounter Date: 07/12/2017      PT End of Session - 07/12/17 1225    Visit Number 6   Number of Visits 10   Date for PT Re-Evaluation 08/17/17   Authorization Type medicare g-code 10th visit   PT Start Time 1146   PT Stop Time 1242   PT Time Calculation (min) 56 min   Activity Tolerance Patient tolerated treatment well   Behavior During Therapy [facility] for tasks assessed/performed      Past Medical History:  Diagnosis Date  . GERD (gastroesophageal reflux disease)   . Hyperlipidemia   . Hypertension   . Spinal stenosis at L4-L5 level   . Spondylisthesis     Past Surgical History:  Procedure Laterality Date  . ABDOMINAL HYSTERECTOMY  1990    There were no vitals filed for this visit.      Subjective Assessment - 07/12/17 1149    Subjective Pt had injection into Lt side of lumbar spine last week.  Not much change.  I had a lot of company over the past week and was not able to rest.     Pertinent History Lumbar Injection: Dr Vira Blanco 07/08/17   Diagnostic tests "pinching of the 4th and 5th nerves in the lumbar spine due to OA" per pt   Currently in Pain? Yes   Pain Score 2    Pain Location Back   Pain Orientation Mid;Lower   Pain Descriptors / Indicators Aching   Pain Type Chronic pain   Pain Onset More than a month ago   Pain Frequency Intermittent   Aggravating Factors  vacuuming, activity, housework, standing                         [facility] Adult PT Treatment/Exercise - 07/12/17 0001      Lumbar Exercises: Stretches   Passive Hamstring Stretch 3 reps;20 seconds   Passive Hamstring Stretch Limitations with green strap   Pelvic Tilt 5 reps;10 seconds    Piriformis Stretch 3 reps;20 seconds  thread the needle position     Lumbar Exercises: Aerobic   Stationary Bike level 2x 19min  cues for TrA contraction throughout     Lumbar Exercises: Supine   Ab Set 10 reps   Bridge 15 reps   Bridge Limitations mini bridge with pelvic tilt     Lumbar Exercises: Sidelying   Clam 20 reps   Clam Limitations yellow theraband     Modalities   Modalities Traction     Traction   Type of Traction Lumbar   Min (lbs) 70   Max (lbs) 40   Hold Time 60   Rest Time 20   Time 15                  PT Short Term Goals - 07/12/17 1154      PT SHORT TERM GOAL #2   Title demonstrate correct body mechanics with daily activities, lifting, standing, and sitting   Status Achieved           PT Long Term Goals - 07/12/17 1154      PT LONG TERM GOAL #1   Title independent with HEP   Time 8  Period Weeks   Status On-going     PT LONG TERM GOAL #2   Title vacuum with correct body mechanics or other types of housework in standing with minimal to no pain   Time 8   Period Weeks   Status On-going     PT LONG TERM GOAL #3   Title walking for for 2 or more miles with minimal to no pain due to increased core and hip strength   Baseline moderate increase in pain after 15 minutes   Time 8   Period Weeks   Status On-going     PT LONG TERM GOAL #4   Title sitting in a car for 1.5 hours with minimal to no pain with good back support   Baseline 2 hours   Status Achieved               Plan - 07/12/17 1157    Clinical Impression Statement Pt with 40% overall improvement in lumbar symptoms since the start of care.  Pt had lumbar injection last week with some mild relief of symptoms.  Pt is able to walk for 15 minutes (1 mile) and sit for 2 hours in the car.  Trial of traction today.  Pt will continue to benefit from skilled PT for core strength, flexibility, endurance and traction if helpful.     Rehab Potential Excellent   PT  Frequency 2x / week   PT Duration 8 weeks   PT Treatment/Interventions Cryotherapy;Electrical Stimulation;Moist Heat;Traction;Ultrasound;Patient/family education;Neuromuscular re-education;Therapeutic exercise;Therapeutic activities;Manual techniques;Passive range of motion;Dry needling;Taping   PT Next Visit Plan core strength; Hip ER ROM; hip strength.     Consulted and Agree with Plan of Care Patient      Patient will benefit from skilled therapeutic intervention in order to improve the following deficits and impairments:  Increased fascial restricitons, Decreased range of motion, Increased muscle spasms, Pain, Decreased activity tolerance, Decreased mobility, Decreased strength, Impaired flexibility  Visit Diagnosis: Chronic left-sided low back pain with left-sided sciatica  Muscle weakness (generalized)     Problem List There are no active problems to display for this patient.    Lauren Martinez, PT 07/12/17 12:31 PM  [facility] [address], [place], [place], [address] Phone: [phone]   Fax:  [phone]  Name: Lauren Martinez MRN: [id number] Date of Birth: Aug 24, 1951

## 2017-07-14 ENCOUNTER — Ambulatory Visit: Payer: Medicare Other

## 2017-07-14 DIAGNOSIS — G8929 Other chronic pain: Secondary | ICD-10-CM | POA: Diagnosis not present

## 2017-07-14 DIAGNOSIS — M6281 Muscle weakness (generalized): Secondary | ICD-10-CM | POA: Diagnosis not present

## 2017-07-14 DIAGNOSIS — M5442 Lumbago with sciatica, left side: Secondary | ICD-10-CM | POA: Diagnosis not present

## 2017-07-14 NOTE — Therapy (Signed)
[facility] [address] [place], [place], [address] Phone: [phone]   Fax:  [phone]  Physical Therapy Treatment  Patient Details  Name: Lauren Martinez MRN: [id number] Date of Birth: Feb 05, 1951 Referring Provider: Dr. London Pepper  Encounter Date: 07/14/2017      PT End of Session - 07/14/17 0949    Visit Number 7   Number of Visits 10   Date for PT Re-Evaluation 08/17/17   Authorization Type medicare g-code 10th visit   PT Start Time 0930   PT Stop Time 1019   PT Time Calculation (min) 49 min   Activity Tolerance Patient tolerated treatment well   Behavior During Therapy [facility] for tasks assessed/performed      Past Medical History:  Diagnosis Date  . GERD (gastroesophageal reflux disease)   . Hyperlipidemia   . Hypertension   . Spinal stenosis at L4-L5 level   . Spondylisthesis     Past Surgical History:  Procedure Laterality Date  . ABDOMINAL HYSTERECTOMY  1990    There were no vitals filed for this visit.      Subjective Assessment - 07/14/17 0936    Subjective My back was a little sore after traction, but overall better.     Pertinent History Lumbar Injection: Dr Vira Blanco 07/08/17   Currently in Pain? Yes   Pain Score 1    Pain Location Back   Pain Orientation Mid;Lower                         [facility] Adult PT Treatment/Exercise - 07/14/17 0001      Lumbar Exercises: Stretches   Passive Hamstring Stretch 3 reps;20 seconds   Passive Hamstring Stretch Limitations with green strap   Pelvic Tilt 5 reps;10 seconds   Piriformis Stretch 3 reps;20 seconds  thread the needle position     Lumbar Exercises: Aerobic   Stationary Bike NuStep: level 1 x 8 minutes     Lumbar Exercises: Supine   Bridge 15 reps   Bridge Limitations mini bridge with pelvic tilt     Lumbar Exercises: Sidelying   Clam 20 reps   Clam Limitations yellow theraband     Modalities   Modalities Traction      Traction   Type of Traction Lumbar   Min (lbs) 70   Max (lbs) 40   Hold Time 60   Rest Time 20   Time 15                  PT Short Term Goals - 07/12/17 1154      PT SHORT TERM GOAL #2   Title demonstrate correct body mechanics with daily activities, lifting, standing, and sitting   Status Achieved           PT Long Term Goals - 07/12/17 1154      PT LONG TERM GOAL #1   Title independent with HEP   Time 8   Period Weeks   Status On-going     PT LONG TERM GOAL #2   Title vacuum with correct body mechanics or other types of housework in standing with minimal to no pain   Time 8   Period Weeks   Status On-going     PT LONG TERM GOAL #3   Title walking for for 2 or more miles with minimal to no pain due to increased core and hip strength   Baseline moderate increase in pain  after 15 minutes   Time 8   Period Weeks   Status On-going     PT LONG TERM GOAL #4   Title sitting in a car for 1.5 hours with minimal to no pain with good back support   Baseline 2 hours   Status Achieved               Plan - 07/14/17 0944    Clinical Impression Statement Pt reports 40% overall improvement in lumbar symptoms since the start of care.  Pt had lumbar injection last week with some relief of symptoms.  Pt is able to walk for 15 minutes (1 mile) and sit for 2 hours in the car.  Pt is responding well to traction.  Pt will continue to benefit from skilled PT for strength, endurance, flexiblity and traction if helpful.     Rehab Potential Excellent   PT Frequency 2x / week   PT Duration 8 weeks   PT Treatment/Interventions Cryotherapy;Electrical Stimulation;Moist Heat;Traction;Ultrasound;Patient/family education;Neuromuscular re-education;Therapeutic exercise;Therapeutic activities;Manual techniques;Passive range of motion;Dry needling;Taping   PT Next Visit Plan core strength; Hip ER ROM; hip strength.  Traction   Recommended Other Services initial certification  is signed.   Consulted and Agree with Plan of Care Patient      Patient will benefit from skilled therapeutic intervention in order to improve the following deficits and impairments:  Increased fascial restricitons, Decreased range of motion, Increased muscle spasms, Pain, Decreased activity tolerance, Decreased mobility, Decreased strength, Impaired flexibility  Visit Diagnosis: Chronic left-sided low back pain with left-sided sciatica  Muscle weakness (generalized)     Problem List There are no active problems to display for this patient.    Sigurd Sos, PT 07/14/17 10:04 AM  [facility] [address], [place], [place], [address] Phone: [phone]   Fax:  [phone]  Name: Lauren Martinez MRN: [id number] Date of Birth: Apr 05, 1951

## 2017-07-19 ENCOUNTER — Ambulatory Visit: Payer: Medicare Other | Admitting: Physical Therapy

## 2017-07-19 ENCOUNTER — Encounter: Payer: Self-pay | Admitting: Physical Therapy

## 2017-07-19 DIAGNOSIS — M5442 Lumbago with sciatica, left side: Secondary | ICD-10-CM | POA: Diagnosis not present

## 2017-07-19 DIAGNOSIS — M6281 Muscle weakness (generalized): Secondary | ICD-10-CM | POA: Diagnosis not present

## 2017-07-19 DIAGNOSIS — G8929 Other chronic pain: Secondary | ICD-10-CM

## 2017-07-19 NOTE — Therapy (Signed)
[facility] [address] [place], [place], [address] Phone: [phone]   Fax:  [phone]  Physical Therapy Treatment  Patient Details  Name: Lauren Martinez MRN: [id number] Date of Birth: 12/23/1950 Referring Provider: Dr. London Pepper  Encounter Date: 07/19/2017      PT End of Session - 07/19/17 0934    Visit Number 8   Number of Visits 10   Date for PT Re-Evaluation 08/17/17   Authorization Type medicare g-code 10th visit   PT Start Time 0932   PT Stop Time 1022   PT Time Calculation (min) 50 min   Activity Tolerance Patient tolerated treatment well   Behavior During Therapy [facility] for tasks assessed/performed      Past Medical History:  Diagnosis Date  . GERD (gastroesophageal reflux disease)   . Hyperlipidemia   . Hypertension   . Spinal stenosis at L4-L5 level   . Spondylisthesis     Past Surgical History:  Procedure Laterality Date  . ABDOMINAL HYSTERECTOMY  1990    There were no vitals filed for this visit.      Subjective Assessment - 07/19/17 0934    Subjective I have been doing a lot better, I think the machine is helping.  I did nothing yesterday due to a HA.     Pertinent History Lumbar Injection: Dr Vira Blanco 07/08/17   Limitations Standing;Walking   Diagnostic tests "pinching of the 4th and 5th nerves in the lumbar spine due to OA" per pt   Patient Stated Goals be painfree or less pain with activitiy   Currently in Pain? Yes   Pain Score 1   barely there   Pain Location Back   Pain Orientation Mid;Lower   Pain Descriptors / Indicators Throbbing   Pain Type Chronic pain   Pain Onset More than a month ago   Pain Frequency Intermittent   Aggravating Factors  push mowing increased it a little   Pain Relieving Factors moving a little in the morning, soaking in the tub   Multiple Pain Sites No                         [facility] Adult PT Treatment/Exercise - 07/19/17 0001       Neuro Re-ed    Neuro Re-ed Details  abominal bracing with standing and supine exercises     Lumbar Exercises: Stretches   Passive Hamstring Stretch 3 reps;20 seconds   Pelvic Tilt 5 reps;10 seconds   Piriformis Stretch 3 reps;20 seconds  thread the needle position     Lumbar Exercises: Aerobic   Stationary Bike NuStep: level 2 x 8 minutes     Lumbar Exercises: Standing   Shoulder Extension Strengthening;Power Tower;Both;20 reps  15#     Lumbar Exercises: Supine   Bridge 15 reps;3 seconds   Bridge Limitations mini bridge with pelvic tilt     Traction   Type of Traction Lumbar   Min (lbs) 70   Max (lbs) 40   Hold Time 60   Rest Time 20   Time 15                  PT Short Term Goals - 07/12/17 1154      PT SHORT TERM GOAL #2   Title demonstrate correct body mechanics with daily activities, lifting, standing, and sitting   Status Achieved           PT Long Term Goals -  07/19/17 0937      PT LONG TERM GOAL #1   Title independent with HEP   Time 8   Period Weeks   Status On-going     PT LONG TERM GOAL #2   Title vacuum with correct body mechanics or other types of housework in standing with minimal to no pain   Time 8   Period Weeks   Status On-going     PT LONG TERM GOAL #3   Title walking for for 2 or more miles with minimal to no pain due to increased core and hip strength   Baseline pain is much lower, but haven't been walking that much   Time 8   Period Weeks   Status On-going     PT LONG TERM GOAL #5   Title FOTO score </= 27% limitation   Time 8   Period Weeks   Status On-going               Plan - 07/19/17 0934    Clinical Impression Statement Patient continues to demonstrate improvement and reports normal activities aren't increasing her pain.  She doesn't have increased pain with vaccuming but she is still having difficulty with mopping.  Pt needs cues for posture and abdominal bracing especially with standing exercise.   She will continue to benefit from skilled PT for improved core strength and posutre.   PT Treatment/Interventions Cryotherapy;Electrical Stimulation;Moist Heat;Traction;Ultrasound;Patient/family education;Neuromuscular re-education;Therapeutic exercise;Therapeutic activities;Manual techniques;Passive range of motion;Dry needling;Taping   PT Next Visit Plan core strength; Hip ER ROM; hip strength.  Traction   Consulted and Agree with Plan of Care Patient      Patient will benefit from skilled therapeutic intervention in order to improve the following deficits and impairments:  Increased fascial restricitons, Decreased range of motion, Increased muscle spasms, Pain, Decreased activity tolerance, Decreased mobility, Decreased strength, Impaired flexibility  Visit Diagnosis: Chronic left-sided low back pain with left-sided sciatica  Muscle weakness (generalized)     Problem List There are no active problems to display for this patient.   Zannie Cove, PT 07/19/2017, 10:12 AM  [facility] [address] [place], [place], [address] Phone: [phone]   Fax:  [phone]  Name: Lauren Martinez MRN: [id number] Date of Birth: [birth year]

## 2017-07-21 ENCOUNTER — Ambulatory Visit: Payer: Medicare Other

## 2017-07-21 DIAGNOSIS — G8929 Other chronic pain: Secondary | ICD-10-CM | POA: Diagnosis not present

## 2017-07-21 DIAGNOSIS — M5442 Lumbago with sciatica, left side: Principal | ICD-10-CM

## 2017-07-21 DIAGNOSIS — M6281 Muscle weakness (generalized): Secondary | ICD-10-CM | POA: Diagnosis not present

## 2017-07-21 NOTE — Therapy (Signed)
[facility] [address], [place], [place], [address] Phone: [phone]   Fax:  [phone]  Physical Therapy Treatment  Patient Details  Name: Lauren Martinez MRN: [id number] Date of Birth: 02-04-1951 Referring Provider: Dr. London Pepper  Encounter Date: 07/21/2017      PT End of Session - 07/21/17 1007    Visit Number 9   Number of Visits 10   Date for PT Re-Evaluation 08/17/17   Authorization Type medicare g-code 10th visit   PT Start Time 0932   PT Stop Time 1022   PT Time Calculation (min) 50 min   Activity Tolerance Patient tolerated treatment well   Behavior During Therapy [facility] for tasks assessed/performed      Past Medical History:  Diagnosis Date  . GERD (gastroesophageal reflux disease)   . Hyperlipidemia   . Hypertension   . Spinal stenosis at L4-L5 level   . Spondylisthesis     Past Surgical History:  Procedure Laterality Date  . ABDOMINAL HYSTERECTOMY  1990    There were no vitals filed for this visit.      Subjective Assessment - 07/21/17 0942    Subjective I am doing well.  A little sore after traction but it seems been helping.     Pertinent History Lumbar Injection: Dr Vira Blanco 07/08/17   Currently in Pain? Yes   Pain Score 0-No pain   Pain Location Back   Pain Orientation Mid;Lower   Pain Descriptors / Indicators Throbbing   Pain Type Chronic pain   Pain Onset More than a month ago   Pain Frequency Intermittent   Aggravating Factors  housework, mowing   Pain Relieving Factors moving around in the morning, heat                         [facility] Adult PT Treatment/Exercise - 07/21/17 0001      Lumbar Exercises: Stretches   Passive Hamstring Stretch 3 reps;20 seconds   Pelvic Tilt 5 reps;10 seconds   Piriformis Stretch 3 reps;20 seconds  thread the needle position     Lumbar Exercises: Aerobic   Stationary Bike level 2x 95min  cues for TrA contraction throughout     Lumbar Exercises: Standing   Shoulder Extension Strengthening;Power Tower;Both;20 reps  15#     Lumbar Exercises: Supine   Bridge 3 seconds;20 reps  with ball squeeze     Lumbar Exercises: Sidelying   Clam 20 reps   Clam Limitations yellow theraband     Traction   Type of Traction Lumbar   Min (lbs) 70   Max (lbs) 40   Hold Time 60   Rest Time 20   Time 15                  PT Short Term Goals - 07/12/17 1154      PT SHORT TERM GOAL #2   Title demonstrate correct body mechanics with daily activities, lifting, standing, and sitting   Status Achieved           PT Long Term Goals - 07/21/17 0946      PT LONG TERM GOAL #1   Title independent with HEP   Time 8   Period Weeks   Status On-going     PT LONG TERM GOAL #2   Title vacuum with correct body mechanics or other types of housework in standing with minimal to no pain   Status Achieved  PT LONG TERM GOAL #3   Title walking for for 2 or more miles with minimal to no pain due to increased core and hip strength   Baseline pain is much lower, but haven't been walking that much   Time 8   Period Weeks   Status On-going     PT LONG TERM GOAL #5   Title FOTO score </= 27% limitation   Time 8   Period Weeks   Status On-going               Plan - 07/21/17 1610    Clinical Impression Statement Pt reports 50% overall improvement since the start of care.  Pt is independent in core strength and flexibility exercise.  Pt has some soreness after housework but this is overall improved.  Pt with difficulty with cocontraction of abdominals, gluteals and hip adductors today.  Pt is responding well to traction.  Pt will continue to benefit from skilled PT for core strength, flexibility and traction.     Rehab Potential Excellent   PT Frequency 2x / week   PT Duration 8 weeks   PT Treatment/Interventions Cryotherapy;Electrical Stimulation;Moist Heat;Traction;Ultrasound;Patient/family  education;Neuromuscular re-education;Therapeutic exercise;Therapeutic activities;Manual techniques;Passive range of motion;Dry needling;Taping   PT Next Visit Plan core strength; Hip ER ROM; hip strength.  Traction   Consulted and Agree with Plan of Care Patient      Patient will benefit from skilled therapeutic intervention in order to improve the following deficits and impairments:  Increased fascial restricitons, Decreased range of motion, Increased muscle spasms, Pain, Decreased activity tolerance, Decreased mobility, Decreased strength, Impaired flexibility  Visit Diagnosis: Chronic left-sided low back pain with left-sided sciatica  Muscle weakness (generalized)     Problem List There are no active problems to display for this patient.    Sigurd Sos, PT 07/21/17 10:08 AM  [facility] [address], [place], [place], [address] Phone: [phone]   Fax:  [phone]  Name: Lauren Martinez MRN: [id number] Date of Birth: Dec 01, 1950

## 2017-07-27 ENCOUNTER — Ambulatory Visit: Payer: Medicare Other

## 2017-07-27 DIAGNOSIS — M5442 Lumbago with sciatica, left side: Principal | ICD-10-CM

## 2017-07-27 DIAGNOSIS — G8929 Other chronic pain: Secondary | ICD-10-CM | POA: Diagnosis not present

## 2017-07-27 DIAGNOSIS — M6281 Muscle weakness (generalized): Secondary | ICD-10-CM

## 2017-07-27 NOTE — Therapy (Signed)
[facility] [address] [place], [place], [address] Phone: [phone]   Fax:  [phone]  Physical Therapy Treatment  Patient Details  Name: Lauren Martinez MRN: [id number] Date of Birth: [birth year] Referring Provider: Dr. London Pepper  Encounter Date: 07/27/2017      PT End of Session - 07/27/17 1226    Visit Number 10   Number of Visits 20   Date for PT Re-Evaluation 08/17/17   Authorization Type medicare g-code 10th visit   PT Start Time 7035   PT Stop Time 1227   PT Time Calculation (min) 42 min   Activity Tolerance Patient tolerated treatment well   Behavior During Therapy [facility] for tasks assessed/performed      Past Medical History:  Diagnosis Date  . GERD (gastroesophageal reflux disease)   . Hyperlipidemia   . Hypertension   . Spinal stenosis at L4-L5 level   . Spondylisthesis     Past Surgical History:  Procedure Laterality Date  . ABDOMINAL HYSTERECTOMY  1990    There were no vitals filed for this visit.      Subjective Assessment - 07/27/17 1150    Subjective I have been hurting since I left here last time.  I have been doing a lot more housework so that might be the cause.     Pertinent History Lumbar Injection: Dr Vira Blanco 07/08/17   Diagnostic tests "pinching of the 4th and 5th nerves in the lumbar spine due to OA" per pt   Currently in Pain? Yes   Pain Location Back   Pain Orientation Mid;Lower   Pain Descriptors / Indicators Throbbing;Sore   Pain Type Chronic pain   Pain Onset More than a month ago   Pain Frequency Intermittent   Aggravating Factors  housework, mowing   Pain Relieving Factors moving around in the morning, heat            [facility] PT Assessment - 07/27/17 0001      Observation/Other Assessments   Focus on Therapeutic Outcomes (FOTO)  41% limitation                     [facility] Adult PT Treatment/Exercise - 07/27/17 0001      Lumbar Exercises:  Stretches   Passive Hamstring Stretch 3 reps;20 seconds   Pelvic Tilt 5 reps;10 seconds   Piriformis Stretch 3 reps;20 seconds  thread the needle position     Lumbar Exercises: Aerobic   Stationary Bike NuStep: level 2 x 8 minutes     Lumbar Exercises: Standing   Shoulder Extension Strengthening;Power Tower;Both;20 reps  15#     Lumbar Exercises: Supine   Bridge 3 seconds;20 reps  with ball squeeze     Lumbar Exercises: Sidelying   Clam 20 reps   Clam Limitations yellow theraband     Knee/Hip Exercises: Standing   Hip Abduction Stengthening;Both;2 sets;10 reps  with abdominal bracing   Hip Extension Stengthening;Both;2 sets;10 reps                  PT Short Term Goals - 07/12/17 1154      PT SHORT TERM GOAL #2   Title demonstrate correct body mechanics with daily activities, lifting, standing, and sitting   Status Achieved           PT Long Term Goals - 07/27/17 1149      PT LONG TERM GOAL #1   Title independent with HEP   Time 8  Period Weeks     PT LONG TERM GOAL #2   Title vacuum with correct body mechanics or other types of housework in standing with minimal to no pain   Status Achieved     PT LONG TERM GOAL #3   Title walking for for 2 or more miles with minimal to no pain due to increased core and hip strength   Time 8   Status On-going     PT LONG TERM GOAL #4   Title sitting in a car for 1.5 hours with minimal to no pain with good back support   Status Achieved     PT LONG TERM GOAL #5   Title FOTO score </= 27% limitation   Baseline 41% limitation   Time 8   Status On-going               Plan - 08-06-2017 1155    Clinical Impression Statement Pt with increased pain over the past few days.  Pt is independent in current HEP for core strength and flexibility.  Pt able to sit longer in the car while driving.  Pt with improved control of core muscles with exercise today.  PT decided to hold on traction due to flare-up after last  session . Pt will continue to benefit from skilled PT for core strength, endurance, flexibility and manual/modalities as needed.     Rehab Potential Excellent   PT Frequency 2x / week   PT Duration 8 weeks   PT Treatment/Interventions Cryotherapy;Electrical Stimulation;Moist Heat;Traction;Ultrasound;Patient/family education;Neuromuscular re-education;Therapeutic exercise;Therapeutic activities;Manual techniques;Passive range of motion;Dry needling;Taping   PT Next Visit Plan core strength; Hip ER ROM; hip strength.  Traction   Consulted and Agree with Plan of Care Patient      Patient will benefit from skilled therapeutic intervention in order to improve the following deficits and impairments:  Increased fascial restricitons, Decreased range of motion, Increased muscle spasms, Pain, Decreased activity tolerance, Decreased mobility, Decreased strength, Impaired flexibility  Visit Diagnosis: Chronic left-sided low back pain with left-sided sciatica  Muscle weakness (generalized)       G-Codes - 08-06-2017 1203    Functional Assessment Tool Used (Outpatient Only) FOTO and clinical judgement   Functional Limitation Mobility: Walking and moving around   Mobility: Walking and Moving Around Current Status [phone]) At least 20 percent but less than 40 percent impaired, limited or restricted   Mobility: Walking and Moving Around Goal Status [phone]) At least 20 percent but less than 40 percent impaired, limited or restricted      Problem List There are no active problems to display for this patient.    Sigurd Sos, PT 08/06/17 12:29 PM  [facility] [address], [place], [place], [address] Phone: [phone]   Fax:  [phone]  Name: Lauren Martinez MRN: [id number] Date of Birth: [birth year]

## 2017-07-29 ENCOUNTER — Ambulatory Visit: Payer: Medicare Other

## 2017-07-29 DIAGNOSIS — G8929 Other chronic pain: Secondary | ICD-10-CM

## 2017-07-29 DIAGNOSIS — M6281 Muscle weakness (generalized): Secondary | ICD-10-CM | POA: Diagnosis not present

## 2017-07-29 DIAGNOSIS — M5442 Lumbago with sciatica, left side: Secondary | ICD-10-CM | POA: Diagnosis not present

## 2017-07-29 NOTE — Therapy (Signed)
[facility] [address], [place], [place], [address] Phone: [phone]   Fax:  [phone]  Physical Therapy Treatment  Patient Details  Name: Lauren Martinez MRN: [id number] Date of Birth: 1951-07-22 Referring Provider: Dr. London Pepper  Encounter Date: 07/29/2017      PT End of Session - 07/29/17 1215    Visit Number 11   Number of Visits 20   Date for PT Re-Evaluation 08/17/17   Authorization Type medicare g-code 20th visit   PT Start Time 1287   PT Stop Time 1231   PT Time Calculation (min) 47 min   Activity Tolerance Patient tolerated treatment well   Behavior During Therapy [facility] for tasks assessed/performed      Past Medical History:  Diagnosis Date  . GERD (gastroesophageal reflux disease)   . Hyperlipidemia   . Hypertension   . Spinal stenosis at L4-L5 level   . Spondylisthesis     Past Surgical History:  Procedure Laterality Date  . ABDOMINAL HYSTERECTOMY  1990    There were no vitals filed for this visit.      Subjective Assessment - 07/29/17 1138    Subjective I did a lot of standing due to having dinner guests yesterday.     Currently in Pain? Yes   Pain Score 2    Pain Location Back   Pain Orientation Lower                         [facility] Adult PT Treatment/Exercise - 07/29/17 0001      Lumbar Exercises: Stretches   Passive Hamstring Stretch 3 reps;20 seconds   Pelvic Tilt 5 reps;10 seconds   Piriformis Stretch 3 reps;20 seconds  thread the needle position     Lumbar Exercises: Aerobic   Stationary Bike NuStep: level 2 x 8 minutes     Lumbar Exercises: Standing   Shoulder Extension Strengthening;Power Tower;Both;20 reps  15#     Lumbar Exercises: Supine   Bridge 3 seconds;20 reps  with ball squeeze     Traction   Type of Traction Lumbar   Min (lbs) 70   Max (lbs) 40   Hold Time 60   Rest Time 20   Time 15                  PT Short Term  Goals - 07/12/17 1154      PT SHORT TERM GOAL #2   Title demonstrate correct body mechanics with daily activities, lifting, standing, and sitting   Status Achieved           PT Long Term Goals - 07/27/17 1149      PT LONG TERM GOAL #1   Title independent with HEP   Time 8   Period Weeks     PT LONG TERM GOAL #2   Title vacuum with correct body mechanics or other types of housework in standing with minimal to no pain   Status Achieved     PT LONG TERM GOAL #3   Title walking for for 2 or more miles with minimal to no pain due to increased core and hip strength   Time 8   Status On-going     PT LONG TERM GOAL #4   Title sitting in a car for 1.5 hours with minimal to no pain with good back support   Status Achieved     PT LONG TERM GOAL #5  Title FOTO score </= 27% limitation   Baseline 41% limitation   Time 8   Status On-going               Plan - 07/29/17 1146    Clinical Impression Statement Pt with increased pain this week due to increased activity.  Pt with continued pain that limits ability to stand and walk long periods.  Pt is independent in HEP for strength and flexibility exercises.  Pt will continue to benefit from skilled PT for strength, flexibility and traction.     Rehab Potential Excellent   PT Frequency 2x / week   PT Duration 8 weeks   PT Treatment/Interventions Cryotherapy;Electrical Stimulation;Moist Heat;Traction;Ultrasound;Patient/family education;Neuromuscular re-education;Therapeutic exercise;Therapeutic activities;Manual techniques;Passive range of motion;Dry needling;Taping   PT Next Visit Plan core strength; Hip ER ROM; hip strength.  Traction.  Pt will return 08/10/17   Consulted and Agree with Plan of Care Patient      Patient will benefit from skilled therapeutic intervention in order to improve the following deficits and impairments:  Increased fascial restricitons, Decreased range of motion, Increased muscle spasms, Pain, Decreased  activity tolerance, Decreased mobility, Decreased strength, Impaired flexibility  Visit Diagnosis: Chronic left-sided low back pain with left-sided sciatica  Muscle weakness (generalized)     Problem List There are no active problems to display for this patient.   Sigurd Sos, PT 07/29/17 12:17 PM  [facility] [address] [place], [place], [address] Phone: [phone]   Fax:  [phone]  Name: Lauren Martinez MRN: [id number] Date of Birth: 24-Jul-1951

## 2017-08-09 DIAGNOSIS — M545 Low back pain: Secondary | ICD-10-CM | POA: Diagnosis not present

## 2017-08-09 DIAGNOSIS — M47817 Spondylosis without myelopathy or radiculopathy, lumbosacral region: Secondary | ICD-10-CM | POA: Diagnosis not present

## 2017-08-09 DIAGNOSIS — M5136 Other intervertebral disc degeneration, lumbar region: Secondary | ICD-10-CM | POA: Diagnosis not present

## 2017-08-09 DIAGNOSIS — M79604 Pain in right leg: Secondary | ICD-10-CM | POA: Diagnosis not present

## 2017-08-10 ENCOUNTER — Ambulatory Visit: Payer: Medicare Other | Attending: Family Medicine | Admitting: Physical Therapy

## 2017-08-10 ENCOUNTER — Encounter: Payer: Self-pay | Admitting: Physical Therapy

## 2017-08-10 VITALS — BP 160/90

## 2017-08-10 DIAGNOSIS — G8929 Other chronic pain: Secondary | ICD-10-CM | POA: Insufficient documentation

## 2017-08-10 DIAGNOSIS — M6281 Muscle weakness (generalized): Secondary | ICD-10-CM

## 2017-08-10 DIAGNOSIS — M5442 Lumbago with sciatica, left side: Secondary | ICD-10-CM | POA: Insufficient documentation

## 2017-08-10 NOTE — Therapy (Signed)
[facility] [address] [place], [place], [address] Phone: [phone]   Fax:  [phone]  Physical Therapy Treatment  Patient Details  Name: Lauren Martinez MRN: [id number] Date of Birth: 1950-12-22 Referring Provider: Dr. London Pepper  Encounter Date: 08/10/2017      PT End of Session - 08/10/17 1232    Visit Number 12   Number of Visits 20   Date for PT Re-Evaluation 08/17/17   Authorization Type medicare g-code 20th visit   PT Start Time 1232   PT Stop Time 1323   PT Time Calculation (min) 51 min   Activity Tolerance Patient tolerated treatment well   Behavior During Therapy [facility] for tasks assessed/performed      Past Medical History:  Diagnosis Date  . GERD (gastroesophageal reflux disease)   . Hyperlipidemia   . Hypertension   . Spinal stenosis at L4-L5 level   . Spondylisthesis     Past Surgical History:  Procedure Laterality Date  . ABDOMINAL HYSTERECTOMY  1990    Vitals:   08/10/17 1244  BP: (!) 160/90        Subjective Assessment - 08/10/17 1244    Subjective Pain is not bad at all today.  Pt is flushed and feels like her blood pressure has elevated since the injection yesterday   Pertinent History Lumbar Injection: Dr Vira Blanco 07/08/17, 08/09/17   Limitations Standing;Walking   Diagnostic tests "pinching of the 4th and 5th nerves in the lumbar spine due to OA" per pt   Patient Stated Goals be painfree or less pain with activitiy   Currently in Pain? Yes   Pain Score 1    Pain Location Back   Pain Orientation Lower   Pain Descriptors / Indicators Sore   Pain Type Chronic pain   Pain Onset More than a month ago   Pain Frequency Intermittent   Multiple Pain Sites No                         [facility] Adult PT Treatment/Exercise - 08/10/17 0001      Lumbar Exercises: Standing   Shoulder Extension Strengthening;Power Tower;Both;20 reps  15#   Other Standing Lumbar  Exercises hip abd and ext  -2lb 2x15     Knee/Hip Exercises: Aerobic   Stationary Bike L0 x 8 min     Traction   Type of Traction Lumbar   Min (lbs) 70   Max (lbs) 40   Hold Time 60   Rest Time 20   Time 15                  PT Short Term Goals - 07/12/17 1154      PT SHORT TERM GOAL #2   Title demonstrate correct body mechanics with daily activities, lifting, standing, and sitting   Status Achieved           PT Long Term Goals - 07/27/17 1149      PT LONG TERM GOAL #1   Title independent with HEP   Time 8   Period Weeks     PT LONG TERM GOAL #2   Title vacuum with correct body mechanics or other types of housework in standing with minimal to no pain   Status Achieved     PT LONG TERM GOAL #3   Title walking for for 2 or more miles with minimal to no pain due to increased core and hip strength  Time 8   Status On-going     PT LONG TERM GOAL #4   Title sitting in a car for 1.5 hours with minimal to no pain with good back support   Status Achieved     PT LONG TERM GOAL #5   Title FOTO score </= 27% limitation   Baseline 41% limitation   Time 8   Status On-going               Plan - 08/10/17 1232    Clinical Impression Statement Patient had elevated blood pressure today 160/90 and states she had been feeling funny since getting her injection yesterday.  PT focused on pain managemnt and gentle core strengthening.  She continues to benefit from PT for improved core strength and alegnment in order to participate fully in functional activities.   Rehab Potential Excellent   PT Treatment/Interventions Cryotherapy;Electrical Stimulation;Moist Heat;Traction;Ultrasound;Patient/family education;Neuromuscular re-education;Therapeutic exercise;Therapeutic activities;Manual techniques;Passive range of motion;Dry needling;Taping   PT Next Visit Plan core strength; Hip ER ROM; hip strength.  Traction   Consulted and Agree with Plan of Care Patient       Patient will benefit from skilled therapeutic intervention in order to improve the following deficits and impairments:  Increased fascial restricitons, Decreased range of motion, Increased muscle spasms, Pain, Decreased activity tolerance, Decreased mobility, Decreased strength, Impaired flexibility  Visit Diagnosis: Chronic left-sided low back pain with left-sided sciatica  Muscle weakness (generalized)     Problem List There are no active problems to display for this patient.   Zannie Cove, PT 08/10/2017, 1:13 PM  [facility] [address], [place], [place], [address] Phone: [phone]   Fax:  [phone]  Name: Lauren Martinez MRN: [id number] Date of Birth: 1950-12-28

## 2017-08-12 ENCOUNTER — Ambulatory Visit: Payer: Medicare Other

## 2017-08-12 DIAGNOSIS — M5442 Lumbago with sciatica, left side: Principal | ICD-10-CM

## 2017-08-12 DIAGNOSIS — M6281 Muscle weakness (generalized): Secondary | ICD-10-CM

## 2017-08-12 DIAGNOSIS — G8929 Other chronic pain: Secondary | ICD-10-CM | POA: Diagnosis not present

## 2017-08-12 NOTE — Therapy (Signed)
[facility] [address] [place], [place], [address] Phone: [phone]   Fax:  [phone]  Physical Therapy Treatment  Patient Details  Name: Tilia Faso MRN: [id number] Date of Birth: 1951-10-16 Referring Provider: Dr. London Pepper  Encounter Date: 08/12/2017      PT End of Session - 08/12/17 1300    Visit Number 13   Number of Visits 20   Date for PT Re-Evaluation 08/17/17   Authorization Type medicare g-code 20th visit   PT Start Time 1230   PT Stop Time 1316   PT Time Calculation (min) 46 min   Activity Tolerance Patient tolerated treatment well   Behavior During Therapy [facility] for tasks assessed/performed      Past Medical History:  Diagnosis Date  . GERD (gastroesophageal reflux disease)   . Hyperlipidemia   . Hypertension   . Spinal stenosis at L4-L5 level   . Spondylisthesis     Past Surgical History:  Procedure Laterality Date  . ABDOMINAL HYSTERECTOMY  1990    There were no vitals filed for this visit.      Subjective Assessment - 08/12/17 1242    Subjective 70% improvement overall.  Pt had injection last week and is feeling better from this.    Currently in Pain? No/denies            [facility] PT Assessment - 08/12/17 0001      Observation/Other Assessments   Focus on Therapeutic Outcomes (FOTO)  31% limitation                     [facility] Adult PT Treatment/Exercise - 08/12/17 0001      Lumbar Exercises: Aerobic   Stationary Bike NuStep: level 2 x 8 minutes  PT present to discuss progress     Lumbar Exercises: Standing   Row Strengthening;Power tower;20 reps   Row Limitations 20#   Shoulder Extension Strengthening;Power Tower;Both;20 reps  15#   Other Standing Lumbar Exercises hip abd and ext  -2lb 2x15     Traction   Type of Traction Lumbar   Min (lbs) 70   Max (lbs) 40   Hold Time 60   Rest Time 20   Time 15                  PT Short Term  Goals - 07/12/17 1154      PT SHORT TERM GOAL #2   Title demonstrate correct body mechanics with daily activities, lifting, standing, and sitting   Status Achieved           PT Long Term Goals - 08/12/17 1238      PT LONG TERM GOAL #1   Title independent with HEP   Time 8   Period Weeks   Status On-going     PT LONG TERM GOAL #2   Title vacuum with correct body mechanics or other types of housework in standing with minimal to no pain   Status Achieved     PT LONG TERM GOAL #3   Title walking for for 2 or more miles with minimal to no pain due to increased core and hip strength   Baseline pain is much lower, but haven't been walking that much   Time 8   Period Weeks   Status On-going     PT LONG TERM GOAL #4   Title sitting in a car for 1.5 hours with minimal to no pain with good back  support   Status Achieved     PT LONG TERM GOAL #5   Title FOTO score </= 27% limitation   Baseline 31% limitation   Time 8   Period Weeks   Status On-going               Plan - 08/12/17 1243    Clinical Impression Statement Pt reports 70% overall improvement in symptoms since the start of care.  Pt is able to sit for driving without increased pain.  Pt with good response to traction and comprehensive strengthing and flexibility.  Pt will attend 1 more session to finalize HEP.     Rehab Potential Excellent   PT Frequency 2x / week   PT Duration 8 weeks   PT Treatment/Interventions Cryotherapy;Electrical Stimulation;Moist Heat;Traction;Ultrasound;Patient/family education;Neuromuscular re-education;Therapeutic exercise;Therapeutic activities;Manual techniques;Passive range of motion;Dry needling;Taping   PT Next Visit Plan D/C next session.  G-codes (FOTO also done)   Consulted and Agree with Plan of Care Patient      Patient will benefit from skilled therapeutic intervention in order to improve the following deficits and impairments:  Increased fascial restricitons, Decreased  range of motion, Increased muscle spasms, Pain, Decreased activity tolerance, Decreased mobility, Decreased strength, Impaired flexibility  Visit Diagnosis: Chronic left-sided low back pain with left-sided sciatica  Muscle weakness (generalized)     Problem List There are no active problems to display for this patient.   Sigurd Sos, PT 08/12/17 1:08 PM  [facility] [address], [place], [place], [address] Phone: [phone]   Fax:  [phone]  Name: Chontel Warning MRN: [id number] Date of Birth: 1950-12-07

## 2017-08-17 ENCOUNTER — Ambulatory Visit: Payer: Medicare Other

## 2017-08-17 DIAGNOSIS — M5442 Lumbago with sciatica, left side: Principal | ICD-10-CM

## 2017-08-17 DIAGNOSIS — M6281 Muscle weakness (generalized): Secondary | ICD-10-CM

## 2017-08-17 DIAGNOSIS — G8929 Other chronic pain: Secondary | ICD-10-CM | POA: Diagnosis not present

## 2017-08-17 NOTE — Therapy (Signed)
[facility] [address] [place], [place], [address] Phone: [phone]   Fax:  [phone]  Physical Therapy Treatment  Patient Details  Name: Lauren Martinez MRN: [id number] Date of Birth: 1951-09-14 Referring Provider: Dr. London Pepper  Encounter Date: 08/17/2017      PT End of Session - 08/17/17 0952    Visit Number 14   PT Start Time 0930   PT Stop Time 1015   PT Time Calculation (min) 45 min   Activity Tolerance Patient tolerated treatment well   Behavior During Therapy [facility] for tasks assessed/performed      Past Medical History:  Diagnosis Date  . GERD (gastroesophageal reflux disease)   . Hyperlipidemia   . Hypertension   . Spinal stenosis at L4-L5 level   . Spondylisthesis     Past Surgical History:  Procedure Laterality Date  . ABDOMINAL HYSTERECTOMY  1990    There were no vitals filed for this visit.      Subjective Assessment - 08/17/17 0927    Subjective Ready for D/C   Currently in Pain? No/denies            [facility] PT Assessment - 08/17/17 0001      Assessment   Medical Diagnosis M43.10 spondylisthesis; M48.061 spinal stenosis at l4-l5 level     Prior Function   Level of Independence Independent     Observation/Other Assessments   Focus on Therapeutic Outcomes (FOTO)  31% limitation                     [facility] Adult PT Treatment/Exercise - 08/17/17 0001      Lumbar Exercises: Stretches   Active Hamstring Stretch 3 reps;20 seconds     Lumbar Exercises: Standing   Row Strengthening;Power tower;20 reps   Row Limitations 20#   Shoulder Extension Strengthening;Power Tower;Both;20 reps  15#   Other Standing Lumbar Exercises hip abd and ext  -2lb 2x15     Knee/Hip Exercises: Aerobic   Stationary Bike L1 x 8 min     Traction   Type of Traction Lumbar   Min (lbs) 70   Max (lbs) 40   Hold Time 60   Rest Time 20   Time 15                  PT Short  Term Goals - 07/12/17 1154      PT SHORT TERM GOAL #2   Title demonstrate correct body mechanics with daily activities, lifting, standing, and sitting   Status Achieved           PT Long Term Goals - 08/17/17 0937      PT LONG TERM GOAL #1   Title independent with HEP   Status Achieved     PT LONG TERM GOAL #2   Title vacuum with correct body mechanics or other types of housework in standing with minimal to no pain   Status Achieved     PT LONG TERM GOAL #3   Title walking for for 2 or more miles with minimal to no pain due to increased core and hip strength   Status Partially Met     PT LONG TERM GOAL #4   Title sitting in a car for 1.5 hours with minimal to no pain with good back support   Status Achieved     PT LONG TERM GOAL #5   Title FOTO score </= 27% limitation   Baseline 31%  limitation   Status Partially Met               Plan - 09/07/17 0938    Clinical Impression Statement Pt reports 70% overall improvement in symptoms since the start of care.  Pt will D/C to HEP for further gains.  Pt with good response to traction.  Pt will continue with HEP for further gains after D/C   PT Next Visit Plan D/C PT to HEP   Consulted and Agree with Plan of Care Patient      Patient will benefit from skilled therapeutic intervention in order to improve the following deficits and impairments:     Visit Diagnosis: Chronic left-sided low back pain with left-sided sciatica  Muscle weakness (generalized)       G-Codes - September 07, 2017 0938    Functional Assessment Tool Used (Outpatient Only) FOTO: 31% limitation   Functional Limitation Mobility: Walking and moving around   Mobility: Walking and Moving Around Goal Status [phone]) At least 20 percent but less than 40 percent impaired, limited or restricted   Mobility: Walking and Moving Around Discharge Status [phone]) At least 20 percent but less than 40 percent impaired, limited or restricted      Problem List There  are no active problems to display for this patient.  PHYSICAL THERAPY DISCHARGE SUMMARY  Visits from Start of Care: 14  Current functional level related to goals / functional outcomes: See above for current status.     Remaining deficits: Intermittent LBP and LE pain.  Pt has HEP in place for continued gains.     Education / Equipment: HEP Plan: Patient agrees to discharge.  Patient goals were partially met. Patient is being discharged due to being pleased with the current functional level.  ?????         Sigurd Sos, PT 09/07/17 10:01 AM   [facility] [address], [place], [place], [address] Phone: [phone]   Fax:  [phone]  Name: Lauren Martinez MRN: [id number] Date of Birth: 03-17-1951

## 2017-08-27 DIAGNOSIS — Z23 Encounter for immunization: Secondary | ICD-10-CM | POA: Diagnosis not present

## 2017-09-06 DIAGNOSIS — M4316 Spondylolisthesis, lumbar region: Secondary | ICD-10-CM | POA: Diagnosis not present

## 2017-09-08 DIAGNOSIS — M47817 Spondylosis without myelopathy or radiculopathy, lumbosacral region: Secondary | ICD-10-CM | POA: Diagnosis not present

## 2017-10-18 DIAGNOSIS — M47817 Spondylosis without myelopathy or radiculopathy, lumbosacral region: Secondary | ICD-10-CM | POA: Diagnosis not present

## 2017-10-18 DIAGNOSIS — M5136 Other intervertebral disc degeneration, lumbar region: Secondary | ICD-10-CM | POA: Diagnosis not present

## 2017-10-18 DIAGNOSIS — M79604 Pain in right leg: Secondary | ICD-10-CM | POA: Diagnosis not present

## 2017-10-18 DIAGNOSIS — M545 Low back pain: Secondary | ICD-10-CM | POA: Diagnosis not present

## 2017-11-05 DIAGNOSIS — I10 Essential (primary) hypertension: Secondary | ICD-10-CM | POA: Diagnosis not present

## 2017-11-05 DIAGNOSIS — D72819 Decreased white blood cell count, unspecified: Secondary | ICD-10-CM | POA: Diagnosis not present

## 2017-11-05 DIAGNOSIS — M545 Low back pain: Secondary | ICD-10-CM | POA: Diagnosis not present

## 2017-11-05 DIAGNOSIS — Z23 Encounter for immunization: Secondary | ICD-10-CM | POA: Diagnosis not present

## 2017-11-05 DIAGNOSIS — L309 Dermatitis, unspecified: Secondary | ICD-10-CM | POA: Diagnosis not present

## 2017-11-05 DIAGNOSIS — E785 Hyperlipidemia, unspecified: Secondary | ICD-10-CM | POA: Diagnosis not present

## 2017-11-05 DIAGNOSIS — K219 Gastro-esophageal reflux disease without esophagitis: Secondary | ICD-10-CM | POA: Diagnosis not present

## 2017-12-08 DIAGNOSIS — M5136 Other intervertebral disc degeneration, lumbar region: Secondary | ICD-10-CM | POA: Diagnosis not present

## 2017-12-08 DIAGNOSIS — M545 Low back pain: Secondary | ICD-10-CM | POA: Diagnosis not present

## 2017-12-08 DIAGNOSIS — M47817 Spondylosis without myelopathy or radiculopathy, lumbosacral region: Secondary | ICD-10-CM | POA: Diagnosis not present

## 2017-12-08 DIAGNOSIS — G894 Chronic pain syndrome: Secondary | ICD-10-CM | POA: Diagnosis not present

## 2018-01-03 DIAGNOSIS — R6889 Other general symptoms and signs: Secondary | ICD-10-CM | POA: Diagnosis not present

## 2018-01-25 DIAGNOSIS — R12 Heartburn: Secondary | ICD-10-CM | POA: Diagnosis not present

## 2018-01-25 DIAGNOSIS — Z86010 Personal history of colonic polyps: Secondary | ICD-10-CM | POA: Diagnosis not present

## 2018-03-08 DIAGNOSIS — C44519 Basal cell carcinoma of skin of other part of trunk: Secondary | ICD-10-CM | POA: Diagnosis not present

## 2018-03-08 DIAGNOSIS — C44719 Basal cell carcinoma of skin of left lower limb, including hip: Secondary | ICD-10-CM | POA: Diagnosis not present

## 2018-03-08 DIAGNOSIS — L570 Actinic keratosis: Secondary | ICD-10-CM | POA: Diagnosis not present

## 2018-03-08 DIAGNOSIS — Z85828 Personal history of other malignant neoplasm of skin: Secondary | ICD-10-CM | POA: Diagnosis not present

## 2018-03-08 DIAGNOSIS — D1801 Hemangioma of skin and subcutaneous tissue: Secondary | ICD-10-CM | POA: Diagnosis not present

## 2018-03-08 DIAGNOSIS — L821 Other seborrheic keratosis: Secondary | ICD-10-CM | POA: Diagnosis not present

## 2018-03-08 DIAGNOSIS — D485 Neoplasm of uncertain behavior of skin: Secondary | ICD-10-CM | POA: Diagnosis not present

## 2018-03-14 DIAGNOSIS — R69 Illness, unspecified: Secondary | ICD-10-CM | POA: Diagnosis not present

## 2018-03-24 DIAGNOSIS — M810 Age-related osteoporosis without current pathological fracture: Secondary | ICD-10-CM | POA: Diagnosis not present

## 2018-03-24 DIAGNOSIS — J302 Other seasonal allergic rhinitis: Secondary | ICD-10-CM | POA: Diagnosis not present

## 2018-03-24 DIAGNOSIS — Z803 Family history of malignant neoplasm of breast: Secondary | ICD-10-CM | POA: Diagnosis not present

## 2018-03-24 DIAGNOSIS — R32 Unspecified urinary incontinence: Secondary | ICD-10-CM | POA: Diagnosis not present

## 2018-03-24 DIAGNOSIS — I10 Essential (primary) hypertension: Secondary | ICD-10-CM | POA: Diagnosis not present

## 2018-03-24 DIAGNOSIS — Z800 Family history of malignant neoplasm of digestive organs: Secondary | ICD-10-CM | POA: Diagnosis not present

## 2018-03-24 DIAGNOSIS — Z7983 Long term (current) use of bisphosphonates: Secondary | ICD-10-CM | POA: Diagnosis not present

## 2018-03-24 DIAGNOSIS — E785 Hyperlipidemia, unspecified: Secondary | ICD-10-CM | POA: Diagnosis not present

## 2018-03-24 DIAGNOSIS — G4700 Insomnia, unspecified: Secondary | ICD-10-CM | POA: Diagnosis not present

## 2018-03-24 DIAGNOSIS — K219 Gastro-esophageal reflux disease without esophagitis: Secondary | ICD-10-CM | POA: Diagnosis not present

## 2018-05-11 DIAGNOSIS — Z1239 Encounter for other screening for malignant neoplasm of breast: Secondary | ICD-10-CM | POA: Diagnosis not present

## 2018-05-11 DIAGNOSIS — Z23 Encounter for immunization: Secondary | ICD-10-CM | POA: Diagnosis not present

## 2018-05-11 DIAGNOSIS — E785 Hyperlipidemia, unspecified: Secondary | ICD-10-CM | POA: Diagnosis not present

## 2018-05-11 DIAGNOSIS — Z0000 Encounter for general adult medical examination without abnormal findings: Secondary | ICD-10-CM | POA: Diagnosis not present

## 2018-05-11 DIAGNOSIS — I10 Essential (primary) hypertension: Secondary | ICD-10-CM | POA: Diagnosis not present

## 2018-05-11 DIAGNOSIS — Z1159 Encounter for screening for other viral diseases: Secondary | ICD-10-CM | POA: Diagnosis not present

## 2018-05-11 DIAGNOSIS — D72819 Decreased white blood cell count, unspecified: Secondary | ICD-10-CM | POA: Diagnosis not present

## 2018-05-11 DIAGNOSIS — L659 Nonscarring hair loss, unspecified: Secondary | ICD-10-CM | POA: Diagnosis not present

## 2018-05-16 ENCOUNTER — Other Ambulatory Visit: Payer: Self-pay | Admitting: Family Medicine

## 2018-05-16 DIAGNOSIS — Z1231 Encounter for screening mammogram for malignant neoplasm of breast: Secondary | ICD-10-CM

## 2018-05-30 DIAGNOSIS — I10 Essential (primary) hypertension: Secondary | ICD-10-CM | POA: Diagnosis not present

## 2018-06-08 ENCOUNTER — Ambulatory Visit
Admission: RE | Admit: 2018-06-08 | Discharge: 2018-06-08 | Disposition: A | Discharge status: Home / Self Care | Payer: Medicare HMO | Source: Ambulatory Visit | Attending: Family Medicine | Admitting: Family Medicine

## 2018-06-08 DIAGNOSIS — Z1231 Encounter for screening mammogram for malignant neoplasm of breast: Secondary | ICD-10-CM | POA: Diagnosis not present

## 2018-06-22 DIAGNOSIS — M79652 Pain in left thigh: Secondary | ICD-10-CM | POA: Diagnosis not present

## 2018-06-22 DIAGNOSIS — M47817 Spondylosis without myelopathy or radiculopathy, lumbosacral region: Secondary | ICD-10-CM | POA: Diagnosis not present

## 2018-06-22 DIAGNOSIS — G894 Chronic pain syndrome: Secondary | ICD-10-CM | POA: Diagnosis not present

## 2018-06-29 DIAGNOSIS — M5136 Other intervertebral disc degeneration, lumbar region: Secondary | ICD-10-CM | POA: Diagnosis not present

## 2018-06-29 DIAGNOSIS — M47817 Spondylosis without myelopathy or radiculopathy, lumbosacral region: Secondary | ICD-10-CM | POA: Diagnosis not present

## 2018-07-12 DIAGNOSIS — Z85828 Personal history of other malignant neoplasm of skin: Secondary | ICD-10-CM | POA: Diagnosis not present

## 2018-07-12 DIAGNOSIS — L570 Actinic keratosis: Secondary | ICD-10-CM | POA: Diagnosis not present

## 2018-07-12 DIAGNOSIS — L821 Other seborrheic keratosis: Secondary | ICD-10-CM | POA: Diagnosis not present

## 2018-08-12 DIAGNOSIS — R69 Illness, unspecified: Secondary | ICD-10-CM | POA: Diagnosis not present

## 2018-10-05 DIAGNOSIS — D3131 Benign neoplasm of right choroid: Secondary | ICD-10-CM | POA: Diagnosis not present

## 2018-10-11 DIAGNOSIS — R69 Illness, unspecified: Secondary | ICD-10-CM | POA: Diagnosis not present

## 2018-11-08 DIAGNOSIS — K219 Gastro-esophageal reflux disease without esophagitis: Secondary | ICD-10-CM | POA: Diagnosis not present

## 2018-11-08 DIAGNOSIS — E785 Hyperlipidemia, unspecified: Secondary | ICD-10-CM | POA: Diagnosis not present

## 2018-11-08 DIAGNOSIS — I10 Essential (primary) hypertension: Secondary | ICD-10-CM | POA: Diagnosis not present

## 2018-11-08 DIAGNOSIS — M25552 Pain in left hip: Secondary | ICD-10-CM | POA: Diagnosis not present

## 2018-11-08 DIAGNOSIS — D72819 Decreased white blood cell count, unspecified: Secondary | ICD-10-CM | POA: Diagnosis not present

## 2018-11-11 DIAGNOSIS — M7062 Trochanteric bursitis, left hip: Secondary | ICD-10-CM | POA: Diagnosis not present

## 2018-11-11 DIAGNOSIS — M7061 Trochanteric bursitis, right hip: Secondary | ICD-10-CM | POA: Diagnosis not present

## 2018-11-17 ENCOUNTER — Encounter (HOSPITAL_BASED_OUTPATIENT_CLINIC_OR_DEPARTMENT_OTHER): Payer: Self-pay

## 2018-11-17 ENCOUNTER — Emergency Department (HOSPITAL_BASED_OUTPATIENT_CLINIC_OR_DEPARTMENT_OTHER)
Admission: EM | Admit: 2018-11-17 | Discharge: 2018-11-17 | Disposition: A | Discharge status: Home / Self Care | Payer: Medicare HMO | Attending: Emergency Medicine | Admitting: Emergency Medicine

## 2018-11-17 ENCOUNTER — Emergency Department (HOSPITAL_BASED_OUTPATIENT_CLINIC_OR_DEPARTMENT_OTHER): Payer: Medicare HMO

## 2018-11-17 ENCOUNTER — Other Ambulatory Visit: Payer: Self-pay

## 2018-11-17 DIAGNOSIS — Z79899 Other long term (current) drug therapy: Secondary | ICD-10-CM | POA: Diagnosis not present

## 2018-11-17 DIAGNOSIS — Z87891 Personal history of nicotine dependence: Secondary | ICD-10-CM | POA: Diagnosis not present

## 2018-11-17 DIAGNOSIS — I10 Essential (primary) hypertension: Secondary | ICD-10-CM | POA: Insufficient documentation

## 2018-11-17 DIAGNOSIS — R51 Headache: Secondary | ICD-10-CM | POA: Diagnosis not present

## 2018-11-17 DIAGNOSIS — H9313 Tinnitus, bilateral: Secondary | ICD-10-CM | POA: Diagnosis not present

## 2018-11-17 DIAGNOSIS — Z7982 Long term (current) use of aspirin: Secondary | ICD-10-CM | POA: Insufficient documentation

## 2018-11-17 NOTE — ED Provider Notes (Signed)
[facility] EMERGENCY DEPARTMENT Provider Note   CSN: [id number] Arrival date & time: 11/17/18  1613     History   Chief Complaint Chief Complaint  Patient presents with  . Headache    HPI Lauren Martinez is a 67 y.o. female.  67 y.o female with a PMH of HTN, HLD, presents to the ED with a chief complaint of "buzzing in my brain" x 3 days. Patient reports "buzzing" along the back of her head which is constant and she feels ringing. She has tried taking tylenol along with running warm water on her left ear but reports no relieve in symptoms. She reports she was seen by Ortho for hips pain and was given exercises to try at home, she states the exercises involve her laying on the floor and stretching with her hand behind her head. She endorses some nausea but denies vomiting. She denies any blurry vision, chest pain, shortness of breath or weakness.      Past Medical History:  Diagnosis Date  . GERD (gastroesophageal reflux disease)   . Hyperlipidemia   . Hypertension   . Spinal stenosis at L4-L5 level   . Spondylisthesis     There are no active problems to display for this patient.   Past Surgical History:  Procedure Laterality Date  . ABDOMINAL HYSTERECTOMY  1990  . BREAST BIOPSY    . BREAST EXCISIONAL BIOPSY       OB History   No obstetric history on file.      Home Medications    Prior to Admission medications   Medication Sig Start Date End Date Taking? Authorizing Provider  Omega-3 Fatty Acids (FISH OIL) 1200 MG CAPS Take 1 capsule by mouth daily.   Yes [provider]  alendronate (FOSAMAX) 70 MG tablet Take 70 mg by mouth once a week. Take with a full glass of water on an empty stomach.    [provider]  aspirin EC 81 MG tablet Take 81 mg by mouth daily.    [provider]  calcium carbonate 1250 MG capsule Take 1,250 mg by mouth 2 (two) times daily with a meal.    [provider]  losartan (COZAAR) 50 MG  tablet Take 100 mg by mouth daily.     [provider]  Multiple Vitamins-Minerals (MULTIVITAMIN WITH MINERALS) tablet Take 1 tablet by mouth daily.    [provider]  naproxen sodium (ANAPROX) 220 MG tablet Take 220 mg by mouth 2 (two) times daily with a meal.    [provider]  pantoprazole (PROTONIX) 40 MG tablet Take 40 mg by mouth daily.    [provider]  pravastatin (PRAVACHOL) 20 MG tablet Take 20 mg by mouth daily.    [provider]    Family History Family History  Problem Relation Age of Onset  . Breast cancer Mother     Social History Social History   Tobacco Use  . Smoking status: Former Research scientist (life sciences)  . Smokeless tobacco: Never Used  Substance Use Topics  . Alcohol use: Yes    Comment: daily  . Drug use: Never     Allergies   Penicillins   Review of Systems Review of Systems  Constitutional: Negative for chills and fever.  HENT: Negative for ear discharge, ear pain and sore throat.   Respiratory: Negative for shortness of breath.   Cardiovascular: Negative for chest pain.  Gastrointestinal: Negative for abdominal pain.  Musculoskeletal: Negative for back pain.  Skin:  Negative for pallor and wound.  Neurological: Negative for weakness, light-headedness and headaches.  All other systems reviewed and are negative.    Physical Exam Updated Vital Signs BP (!) 170/102 (BP Location: Left Arm)   Pulse 86   Temp (!) 97.4 F (36.3 C) (Oral)   Resp 18   Ht 5\' 4"  (1.626 m)   Wt 65.8 kg   SpO2 98%   BMI 24.89 kg/m   Physical Exam Vitals signs and nursing note reviewed.  Constitutional:      General: She is not in acute distress.    Appearance: She is well-developed.  HENT:     Head: Normocephalic and atraumatic.     Right Ear: Tympanic membrane and ear canal normal. Tympanic membrane is not perforated, erythematous, retracted or bulging.     Left Ear: There is impacted cerumen.     Mouth/Throat:     Pharynx:  No oropharyngeal exudate.  Eyes:     Pupils: Pupils are equal, round, and reactive to light.  Neck:     Musculoskeletal: Normal range of motion.  Cardiovascular:     Rate and Rhythm: Regular rhythm.     Heart sounds: Normal heart sounds.  Pulmonary:     Effort: Pulmonary effort is normal. No respiratory distress.     Breath sounds: Normal breath sounds.  Abdominal:     General: Bowel sounds are normal. There is no distension.     Palpations: Abdomen is soft.     Tenderness: There is no abdominal tenderness.  Musculoskeletal:        General: No tenderness or deformity.     Right lower leg: No edema.     Left lower leg: No edema.  Skin:    General: Skin is warm and dry.  Neurological:     Mental Status: She is alert and oriented to person, place, and time.      ED Treatments / Results  Labs (all labs ordered are listed, but only abnormal results are displayed) Labs Reviewed - No data to display  EKG None  Radiology Ct Head Wo Contrast  Result Date: 11/17/2018 CLINICAL DATA:  Headache EXAM: CT HEAD WITHOUT CONTRAST TECHNIQUE: Contiguous axial images were obtained from the base of the skull through the vertex without intravenous contrast. COMPARISON:  None. FINDINGS: Brain: No evidence of acute infarction, hemorrhage, hydrocephalus, extra-axial collection or mass lesion/mass effect. Vascular: Negative for hyperdense vessel Skull: Negative Sinuses/Orbits: Negative Other: None IMPRESSION: Negative CT head Electronically Signed   By: Franchot Gallo M.D.   On: 11/17/2018 18:08    Procedures Procedures (including critical care time)  Medications Ordered in ED Medications - No data to display   Initial Impression / Assessment and Plan / ED Course  I have reviewed the triage vital signs and the nursing notes.  Pertinent labs & imaging results that were available during my care of the patient were reviewed by me and considered in my medical decision making (see chart for  details).   Patient presents with "buzzing in her brain" x 3 days. Sent in by PCP for evaluation, during evaluation slight cerumen appreciated on left ear.  She has recently started doing different exercises for her back pain as she has been prescribed by her orthopedist.  Primary care physician sent patient in to rule out any acute emergency.  CT scan head obtained to rule out any infarct or hemorrhage.  CT scan was negative for any acute abnormality.  Patient is well-appearing, has good follow-up  will provide her with a referral to ENT Dr. Redmond Baseman for further evaluation of her tinnitus.  Patient understands and agrees with plan.  Return precautions provided.   Final Clinical Impressions(s) / ED Diagnoses   Final diagnoses:  Tinnitus of both ears    ED Discharge Orders    None       Janeece Fitting, Hershal Coria 11/17/18 Marijo File, MD 11/17/18 [phone]

## 2018-11-17 NOTE — Discharge Instructions (Addendum)
Your CT Scan of your head was normal today. I have provided a referral for ENT Dr. Redmond Baseman, please schedule an appointment to further evaluate the buzzing. Please follow up with primary care as needed.

## 2018-11-17 NOTE — ED Triage Notes (Addendum)
C/o pain to back of head, nausea x today-"hear a buzzing sound" x 3 days-pt denies injury-was advised by PCP office to come to ED-NAD-steady gait

## 2018-11-17 NOTE — ED Notes (Signed)
Flushed LT ear with warm water; tiny piece of wax removed, otherwise negative.

## 2018-11-29 DIAGNOSIS — H903 Sensorineural hearing loss, bilateral: Secondary | ICD-10-CM | POA: Diagnosis not present

## 2018-11-29 DIAGNOSIS — H905 Unspecified sensorineural hearing loss: Secondary | ICD-10-CM | POA: Diagnosis not present

## 2018-11-29 DIAGNOSIS — H9312 Tinnitus, left ear: Secondary | ICD-10-CM | POA: Diagnosis not present

## 2019-02-06 DIAGNOSIS — D485 Neoplasm of uncertain behavior of skin: Secondary | ICD-10-CM | POA: Diagnosis not present

## 2019-02-06 DIAGNOSIS — L570 Actinic keratosis: Secondary | ICD-10-CM | POA: Diagnosis not present

## 2019-02-06 DIAGNOSIS — D045 Carcinoma in situ of skin of trunk: Secondary | ICD-10-CM | POA: Diagnosis not present

## 2019-02-06 DIAGNOSIS — Z85828 Personal history of other malignant neoplasm of skin: Secondary | ICD-10-CM | POA: Diagnosis not present

## 2019-02-06 DIAGNOSIS — L821 Other seborrheic keratosis: Secondary | ICD-10-CM | POA: Diagnosis not present

## 2019-02-06 DIAGNOSIS — D1801 Hemangioma of skin and subcutaneous tissue: Secondary | ICD-10-CM | POA: Diagnosis not present

## 2019-02-06 DIAGNOSIS — L718 Other rosacea: Secondary | ICD-10-CM | POA: Diagnosis not present

## 2019-04-30 IMAGING — MG DIGITAL SCREENING BILATERAL MAMMOGRAM WITH TOMO AND CAD
8 series · 9 of 24 positions shown · non-contrast
Comparison: Previous exam(s).

CLINICAL DATA: Screening.

EXAM:
DIGITAL SCREENING BILATERAL MAMMOGRAM WITH TOMO AND CAD

[R CC synth-2D]
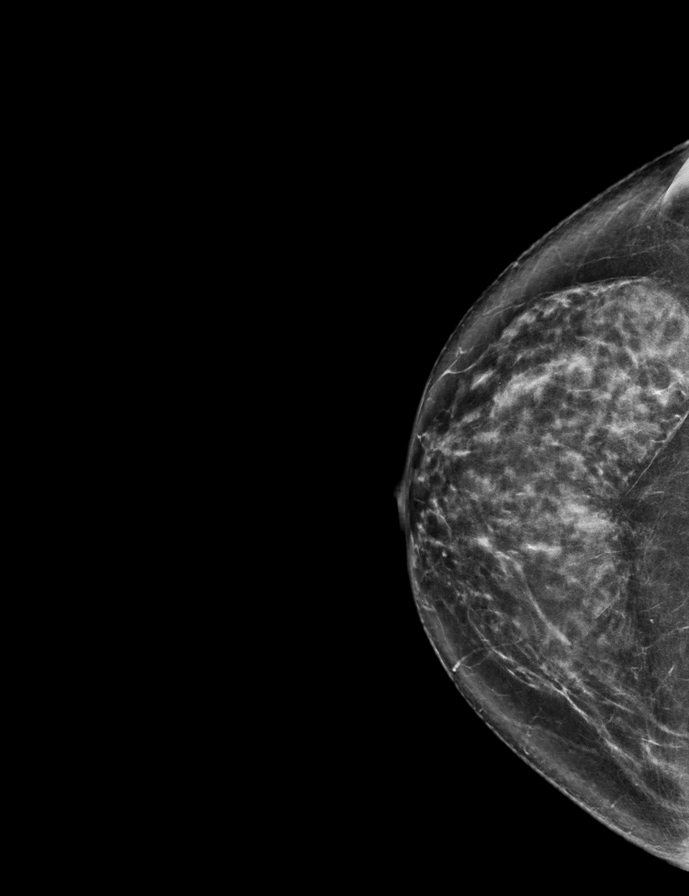

[R MLO synth-2D]
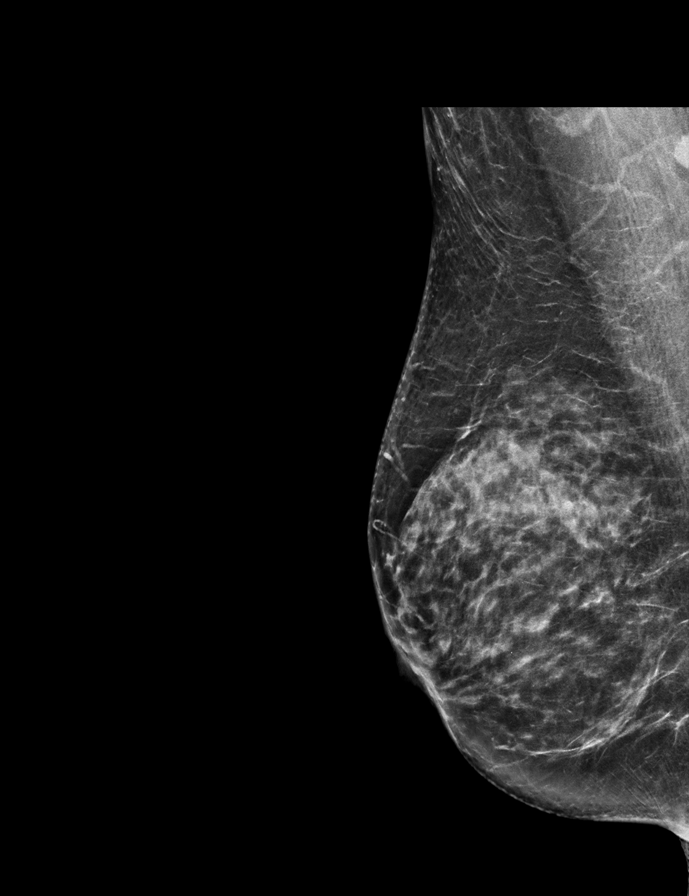

[L MLO synth-2D]
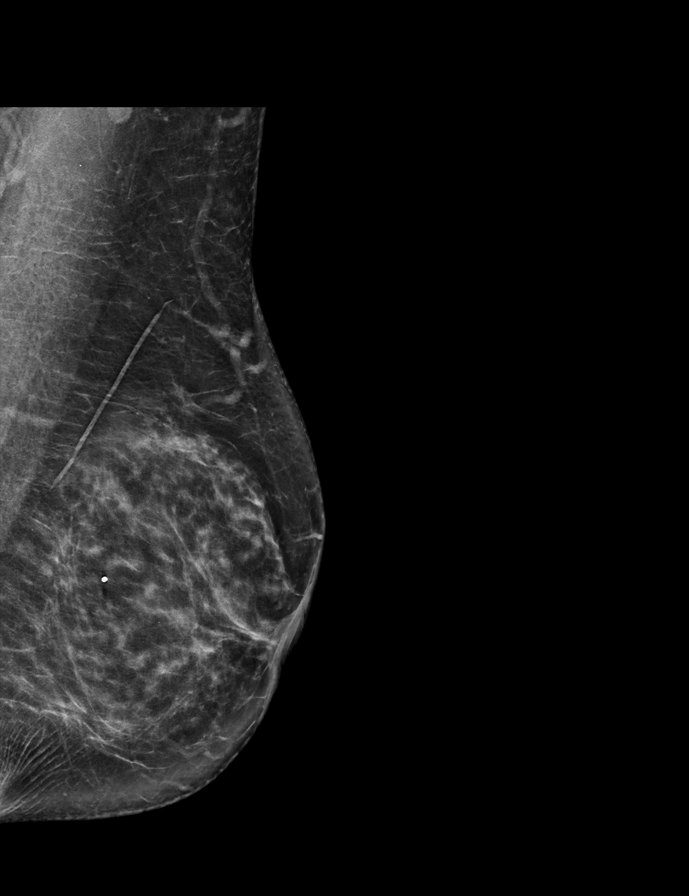

[L CC synth-2D]
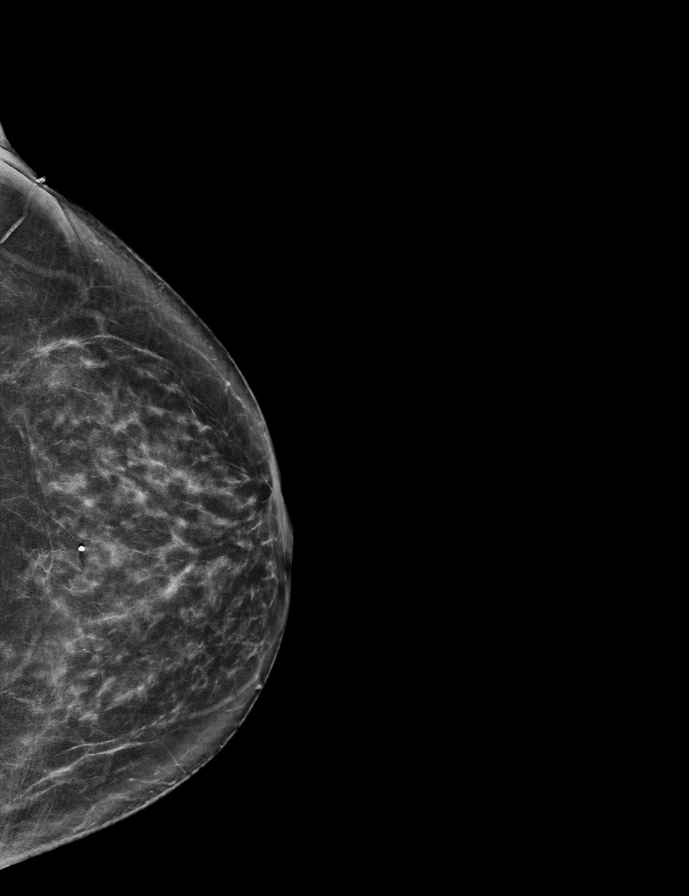

[R CC tomo · 2 of 71 frames shown]
[frame 23/71]
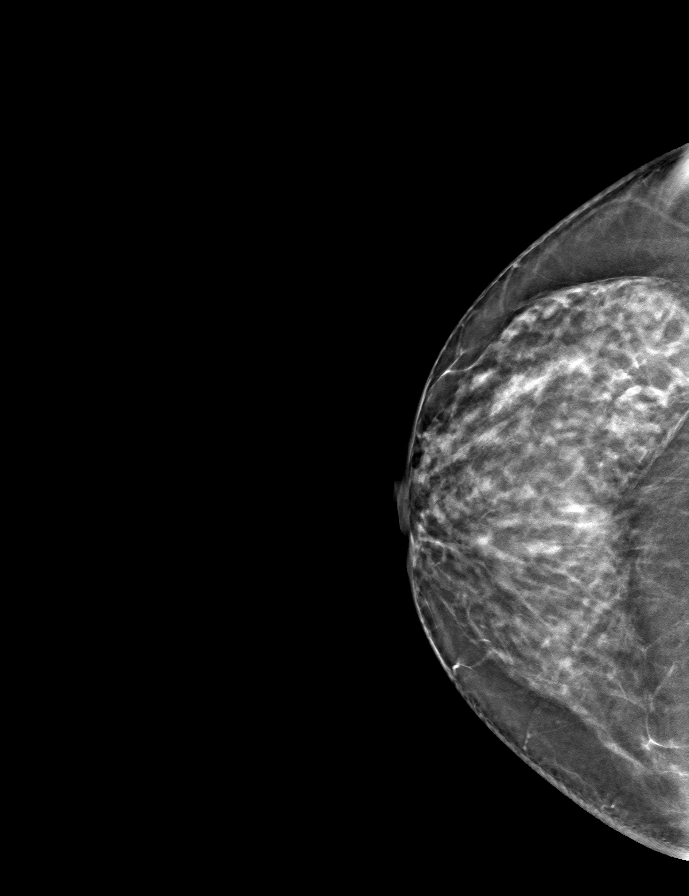
[frame 36/71]
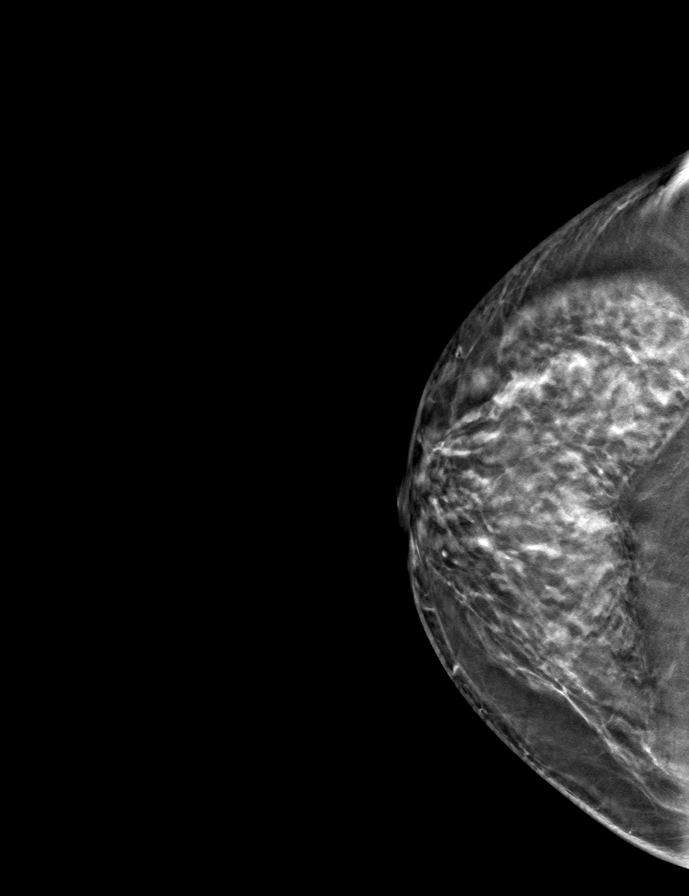

[L MLO tomo · tomo slice 36/71.0]
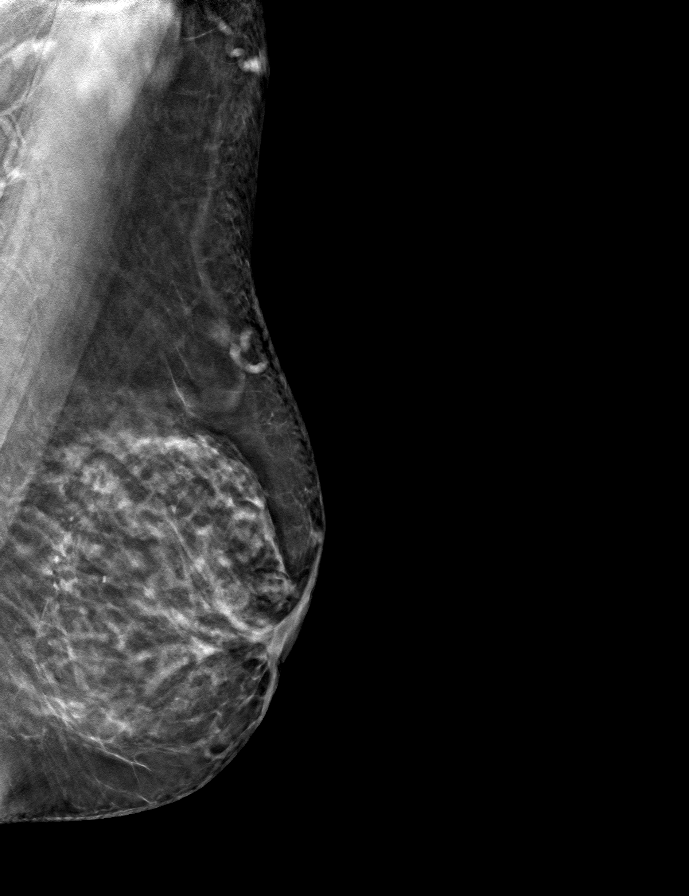

[R MLO tomo · tomo slice 37/72.0]
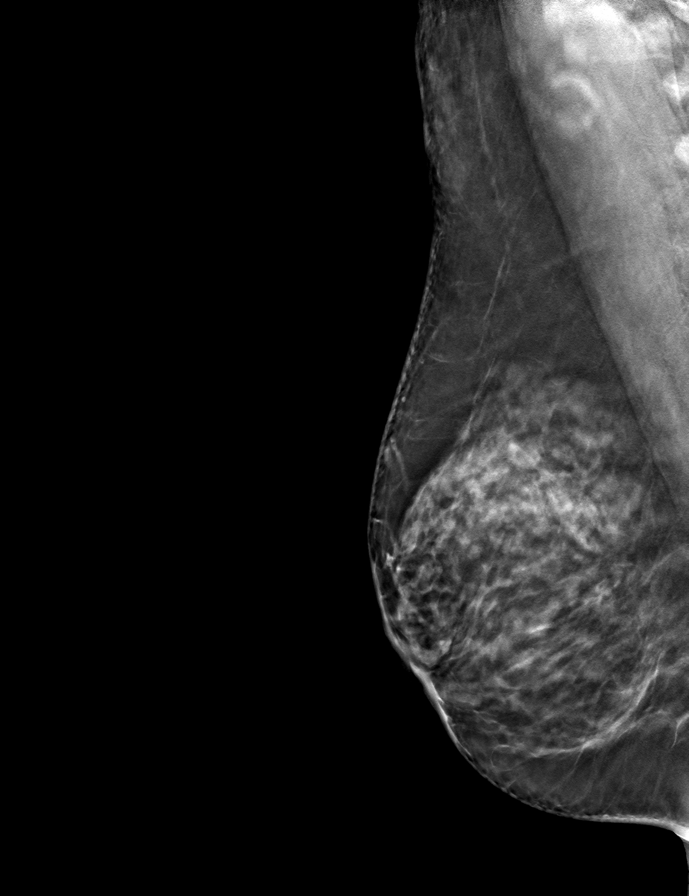

[L CC tomo · tomo slice 35/69.0]
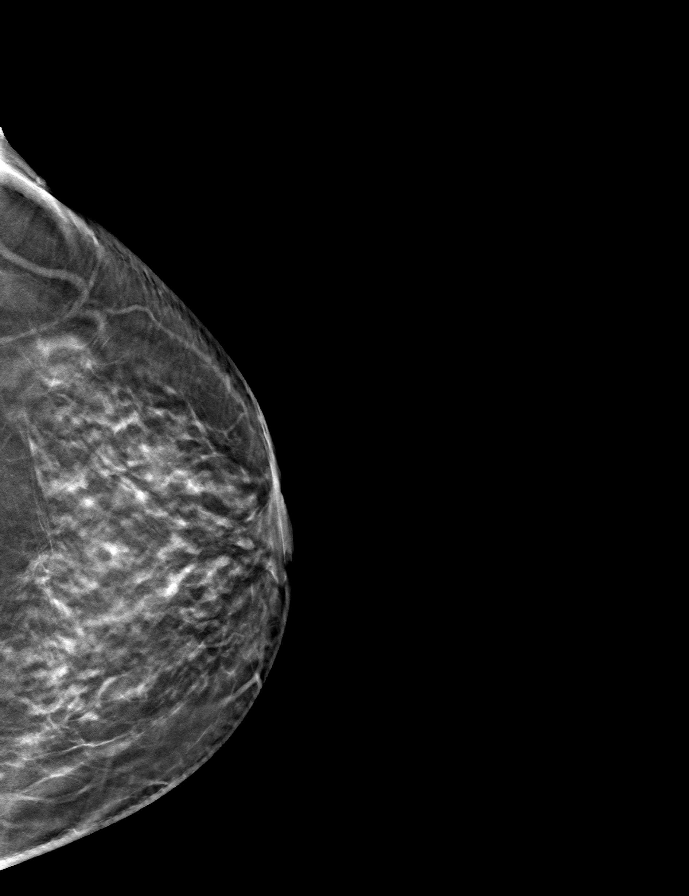

[9 of 24 positions shown; findings below may reference images not displayed]

ACR Breast Density Category c: The breast tissue is heterogeneously
dense, which may obscure small masses.
FINDINGS: There are no findings suspicious for malignancy. Images were
processed with CAD.
IMPRESSION: No mammographic evidence of malignancy. A result letter of this
screening mammogram will be mailed directly to the patient.

RECOMMENDATION:
Screening mammogram in one year. (Code:[id number])

BI-RADS CATEGORY  1: Negative.

## 2019-05-29 DIAGNOSIS — M4316 Spondylolisthesis, lumbar region: Secondary | ICD-10-CM | POA: Diagnosis not present

## 2019-05-29 DIAGNOSIS — M545 Low back pain: Secondary | ICD-10-CM | POA: Diagnosis not present

## 2019-05-29 DIAGNOSIS — M48061 Spinal stenosis, lumbar region without neurogenic claudication: Secondary | ICD-10-CM | POA: Diagnosis not present

## 2019-05-29 DIAGNOSIS — M4726 Other spondylosis with radiculopathy, lumbar region: Secondary | ICD-10-CM | POA: Diagnosis not present

## 2019-06-09 ENCOUNTER — Other Ambulatory Visit: Payer: Self-pay | Admitting: Family Medicine

## 2019-06-09 DIAGNOSIS — M2550 Pain in unspecified joint: Secondary | ICD-10-CM | POA: Diagnosis not present

## 2019-06-09 DIAGNOSIS — M8580 Other specified disorders of bone density and structure, unspecified site: Secondary | ICD-10-CM | POA: Diagnosis not present

## 2019-06-09 DIAGNOSIS — Z0000 Encounter for general adult medical examination without abnormal findings: Secondary | ICD-10-CM | POA: Diagnosis not present

## 2019-06-09 DIAGNOSIS — K219 Gastro-esophageal reflux disease without esophagitis: Secondary | ICD-10-CM | POA: Diagnosis not present

## 2019-06-09 DIAGNOSIS — I10 Essential (primary) hypertension: Secondary | ICD-10-CM | POA: Diagnosis not present

## 2019-06-09 DIAGNOSIS — D72819 Decreased white blood cell count, unspecified: Secondary | ICD-10-CM | POA: Diagnosis not present

## 2019-06-09 DIAGNOSIS — R5381 Other malaise: Secondary | ICD-10-CM

## 2019-06-09 DIAGNOSIS — E785 Hyperlipidemia, unspecified: Secondary | ICD-10-CM | POA: Diagnosis not present

## 2019-06-13 ENCOUNTER — Other Ambulatory Visit: Payer: Self-pay | Admitting: Family Medicine

## 2019-06-13 DIAGNOSIS — M810 Age-related osteoporosis without current pathological fracture: Secondary | ICD-10-CM

## 2019-06-23 DIAGNOSIS — D72819 Decreased white blood cell count, unspecified: Secondary | ICD-10-CM | POA: Diagnosis not present

## 2019-06-23 DIAGNOSIS — M2550 Pain in unspecified joint: Secondary | ICD-10-CM | POA: Diagnosis not present

## 2019-06-23 DIAGNOSIS — K219 Gastro-esophageal reflux disease without esophagitis: Secondary | ICD-10-CM | POA: Diagnosis not present

## 2019-06-23 DIAGNOSIS — E785 Hyperlipidemia, unspecified: Secondary | ICD-10-CM | POA: Diagnosis not present

## 2019-06-23 DIAGNOSIS — M8580 Other specified disorders of bone density and structure, unspecified site: Secondary | ICD-10-CM | POA: Diagnosis not present

## 2019-06-23 DIAGNOSIS — Z0000 Encounter for general adult medical examination without abnormal findings: Secondary | ICD-10-CM | POA: Diagnosis not present

## 2019-07-07 ENCOUNTER — Other Ambulatory Visit: Payer: Self-pay | Admitting: Family Medicine

## 2019-07-07 DIAGNOSIS — Z1231 Encounter for screening mammogram for malignant neoplasm of breast: Secondary | ICD-10-CM

## 2019-07-21 DIAGNOSIS — M4316 Spondylolisthesis, lumbar region: Secondary | ICD-10-CM | POA: Diagnosis not present

## 2019-07-21 DIAGNOSIS — M48062 Spinal stenosis, lumbar region with neurogenic claudication: Secondary | ICD-10-CM | POA: Diagnosis not present

## 2019-08-14 DIAGNOSIS — L720 Epidermal cyst: Secondary | ICD-10-CM | POA: Diagnosis not present

## 2019-08-14 DIAGNOSIS — Z85828 Personal history of other malignant neoplasm of skin: Secondary | ICD-10-CM | POA: Diagnosis not present

## 2019-08-14 DIAGNOSIS — L821 Other seborrheic keratosis: Secondary | ICD-10-CM | POA: Diagnosis not present

## 2019-08-14 DIAGNOSIS — D485 Neoplasm of uncertain behavior of skin: Secondary | ICD-10-CM | POA: Diagnosis not present

## 2019-08-14 DIAGNOSIS — L570 Actinic keratosis: Secondary | ICD-10-CM | POA: Diagnosis not present

## 2019-08-14 DIAGNOSIS — C44519 Basal cell carcinoma of skin of other part of trunk: Secondary | ICD-10-CM | POA: Diagnosis not present

## 2019-08-29 DIAGNOSIS — R69 Illness, unspecified: Secondary | ICD-10-CM | POA: Diagnosis not present

## 2019-09-27 ENCOUNTER — Other Ambulatory Visit: Payer: Self-pay | Admitting: Family Medicine

## 2019-09-27 DIAGNOSIS — M810 Age-related osteoporosis without current pathological fracture: Secondary | ICD-10-CM

## 2019-09-27 DIAGNOSIS — M8580 Other specified disorders of bone density and structure, unspecified site: Secondary | ICD-10-CM

## 2019-09-29 ENCOUNTER — Ambulatory Visit
Admission: RE | Admit: 2019-09-29 | Discharge: 2019-09-29 | Disposition: A | Discharge status: Home / Self Care | Payer: Medicare HMO | Source: Ambulatory Visit | Attending: Family Medicine | Admitting: Family Medicine

## 2019-09-29 ENCOUNTER — Other Ambulatory Visit: Payer: Self-pay

## 2019-09-29 DIAGNOSIS — Z1231 Encounter for screening mammogram for malignant neoplasm of breast: Secondary | ICD-10-CM | POA: Diagnosis not present

## 2019-09-29 DIAGNOSIS — M810 Age-related osteoporosis without current pathological fracture: Secondary | ICD-10-CM

## 2019-09-29 DIAGNOSIS — M8589 Other specified disorders of bone density and structure, multiple sites: Secondary | ICD-10-CM | POA: Diagnosis not present

## 2019-09-29 DIAGNOSIS — Z780 Asymptomatic menopausal state: Secondary | ICD-10-CM | POA: Diagnosis not present

## 2019-09-29 DIAGNOSIS — M8580 Other specified disorders of bone density and structure, unspecified site: Secondary | ICD-10-CM

## 2019-10-02 DIAGNOSIS — R69 Illness, unspecified: Secondary | ICD-10-CM | POA: Diagnosis not present

## 2019-11-01 DIAGNOSIS — R69 Illness, unspecified: Secondary | ICD-10-CM | POA: Diagnosis not present

## 2019-11-09 DIAGNOSIS — E785 Hyperlipidemia, unspecified: Secondary | ICD-10-CM | POA: Diagnosis not present

## 2019-11-09 DIAGNOSIS — I739 Peripheral vascular disease, unspecified: Secondary | ICD-10-CM | POA: Diagnosis not present

## 2019-11-09 DIAGNOSIS — J309 Allergic rhinitis, unspecified: Secondary | ICD-10-CM | POA: Diagnosis not present

## 2019-11-09 DIAGNOSIS — M810 Age-related osteoporosis without current pathological fracture: Secondary | ICD-10-CM | POA: Diagnosis not present

## 2019-11-09 DIAGNOSIS — R69 Illness, unspecified: Secondary | ICD-10-CM | POA: Diagnosis not present

## 2019-11-09 DIAGNOSIS — I10 Essential (primary) hypertension: Secondary | ICD-10-CM | POA: Diagnosis not present

## 2019-11-09 DIAGNOSIS — K219 Gastro-esophageal reflux disease without esophagitis: Secondary | ICD-10-CM | POA: Diagnosis not present

## 2019-11-09 DIAGNOSIS — Z7982 Long term (current) use of aspirin: Secondary | ICD-10-CM | POA: Diagnosis not present

## 2019-11-09 DIAGNOSIS — Z008 Encounter for other general examination: Secondary | ICD-10-CM | POA: Diagnosis not present

## 2019-11-09 DIAGNOSIS — G8929 Other chronic pain: Secondary | ICD-10-CM | POA: Diagnosis not present

## 2019-11-09 DIAGNOSIS — R32 Unspecified urinary incontinence: Secondary | ICD-10-CM | POA: Diagnosis not present

## 2019-11-21 DIAGNOSIS — H5203 Hypermetropia, bilateral: Secondary | ICD-10-CM | POA: Diagnosis not present

## 2019-11-21 DIAGNOSIS — H52223 Regular astigmatism, bilateral: Secondary | ICD-10-CM | POA: Diagnosis not present

## 2019-11-21 DIAGNOSIS — H524 Presbyopia: Secondary | ICD-10-CM | POA: Diagnosis not present

## 2019-12-07 DIAGNOSIS — Z20822 Contact with and (suspected) exposure to covid-19: Secondary | ICD-10-CM | POA: Diagnosis not present

## 2019-12-12 DIAGNOSIS — M2550 Pain in unspecified joint: Secondary | ICD-10-CM | POA: Diagnosis not present

## 2019-12-12 DIAGNOSIS — E785 Hyperlipidemia, unspecified: Secondary | ICD-10-CM | POA: Diagnosis not present

## 2019-12-12 DIAGNOSIS — D72819 Decreased white blood cell count, unspecified: Secondary | ICD-10-CM | POA: Diagnosis not present

## 2019-12-12 DIAGNOSIS — Z95820 Peripheral vascular angioplasty status with implants and grafts: Secondary | ICD-10-CM | POA: Diagnosis not present

## 2019-12-12 DIAGNOSIS — M545 Low back pain: Secondary | ICD-10-CM | POA: Diagnosis not present

## 2019-12-12 DIAGNOSIS — I10 Essential (primary) hypertension: Secondary | ICD-10-CM | POA: Diagnosis not present

## 2019-12-12 DIAGNOSIS — E559 Vitamin D deficiency, unspecified: Secondary | ICD-10-CM | POA: Diagnosis not present

## 2019-12-12 DIAGNOSIS — Z1211 Encounter for screening for malignant neoplasm of colon: Secondary | ICD-10-CM | POA: Diagnosis not present

## 2019-12-21 DIAGNOSIS — E663 Overweight: Secondary | ICD-10-CM | POA: Diagnosis not present

## 2019-12-21 DIAGNOSIS — Z6825 Body mass index (BMI) 25.0-25.9, adult: Secondary | ICD-10-CM | POA: Diagnosis not present

## 2019-12-21 DIAGNOSIS — I739 Peripheral vascular disease, unspecified: Secondary | ICD-10-CM | POA: Diagnosis not present

## 2019-12-21 DIAGNOSIS — M5431 Sciatica, right side: Secondary | ICD-10-CM | POA: Diagnosis not present

## 2019-12-21 DIAGNOSIS — M810 Age-related osteoporosis without current pathological fracture: Secondary | ICD-10-CM | POA: Diagnosis not present

## 2019-12-21 DIAGNOSIS — E538 Deficiency of other specified B group vitamins: Secondary | ICD-10-CM | POA: Diagnosis not present

## 2019-12-21 DIAGNOSIS — M150 Primary generalized (osteo)arthritis: Secondary | ICD-10-CM | POA: Diagnosis not present

## 2019-12-21 DIAGNOSIS — R202 Paresthesia of skin: Secondary | ICD-10-CM | POA: Diagnosis not present

## 2019-12-25 DIAGNOSIS — D72819 Decreased white blood cell count, unspecified: Secondary | ICD-10-CM | POA: Diagnosis not present

## 2019-12-25 DIAGNOSIS — R748 Abnormal levels of other serum enzymes: Secondary | ICD-10-CM | POA: Diagnosis not present

## 2019-12-25 DIAGNOSIS — E559 Vitamin D deficiency, unspecified: Secondary | ICD-10-CM | POA: Diagnosis not present

## 2019-12-25 DIAGNOSIS — E785 Hyperlipidemia, unspecified: Secondary | ICD-10-CM | POA: Diagnosis not present

## 2019-12-27 ENCOUNTER — Other Ambulatory Visit: Payer: Self-pay | Admitting: Family Medicine

## 2019-12-27 DIAGNOSIS — Z95820 Peripheral vascular angioplasty status with implants and grafts: Secondary | ICD-10-CM

## 2019-12-28 ENCOUNTER — Ambulatory Visit: Payer: Medicare HMO

## 2019-12-29 ENCOUNTER — Ambulatory Visit
Admission: RE | Admit: 2019-12-29 | Discharge: 2019-12-29 | Disposition: A | Discharge status: Home / Self Care | Payer: Medicare HMO | Source: Ambulatory Visit | Attending: Family Medicine | Admitting: Family Medicine

## 2019-12-29 DIAGNOSIS — Z95820 Peripheral vascular angioplasty status with implants and grafts: Secondary | ICD-10-CM

## 2019-12-29 DIAGNOSIS — Z136 Encounter for screening for cardiovascular disorders: Secondary | ICD-10-CM | POA: Diagnosis not present

## 2020-01-01 DIAGNOSIS — R69 Illness, unspecified: Secondary | ICD-10-CM | POA: Diagnosis not present

## 2020-01-06 ENCOUNTER — Ambulatory Visit: Payer: Medicare HMO | Attending: Internal Medicine

## 2020-01-06 DIAGNOSIS — Z23 Encounter for immunization: Secondary | ICD-10-CM | POA: Insufficient documentation

## 2020-01-06 NOTE — Progress Notes (Signed)
   Covid-19 Vaccination Clinic  Name:  Lauren Martinez    MRN: [id number] DOB: September 23, 1951  01/06/2020  [name] was observed post Covid-19 immunization for 30 minutes based on pre-vaccination screening without incidence. She was provided with Vaccine Information Sheet and instruction to access the V-Safe system.   [name] was instructed to call 911 with any severe reactions post vaccine: Marland Kitchen Difficulty breathing  . Swelling of your face and throat  . A fast heartbeat  . A bad rash all over your body  . Dizziness and weakness    Immunizations Administered    Name Date Dose VIS Date Route   Pfizer COVID-19 Vaccine 01/06/2020 12:43 PM 0.3 mL 11/10/2019 Intramuscular   Manufacturer: [place]   Lot: [id number]   [place]: [id number]

## 2020-01-18 ENCOUNTER — Ambulatory Visit: Payer: Medicare HMO

## 2020-01-31 ENCOUNTER — Ambulatory Visit: Payer: Medicare HMO | Attending: Internal Medicine

## 2020-01-31 DIAGNOSIS — Z23 Encounter for immunization: Secondary | ICD-10-CM | POA: Insufficient documentation

## 2020-01-31 NOTE — Progress Notes (Signed)
   Covid-19 Vaccination Clinic  Name:  Lauren Martinez    MRN: [id number] DOB: 09-23-1951  01/31/2020  [name] was observed post Covid-19 immunization for 30 minutes based on pre-vaccination screening without incident. She was provided with Vaccine Information Sheet and instruction to access the V-Safe system.   [name] was instructed to call 911 with any severe reactions post vaccine: Marland Kitchen Difficulty breathing  . Swelling of face and throat  . A fast heartbeat  . A bad rash all over body  . Dizziness and weakness   Immunizations Administered    Name Date Dose VIS Date Route   Pfizer COVID-19 Vaccine 01/31/2020  8:57 AM 0.3 mL 11/10/2019 Intramuscular   Manufacturer: [place]   Lot: [id number]   [place]: [id number]

## 2020-02-13 DIAGNOSIS — L570 Actinic keratosis: Secondary | ICD-10-CM | POA: Diagnosis not present

## 2020-02-13 DIAGNOSIS — Z85828 Personal history of other malignant neoplasm of skin: Secondary | ICD-10-CM | POA: Diagnosis not present

## 2020-02-13 DIAGNOSIS — D1801 Hemangioma of skin and subcutaneous tissue: Secondary | ICD-10-CM | POA: Diagnosis not present

## 2020-02-13 DIAGNOSIS — D2272 Melanocytic nevi of left lower limb, including hip: Secondary | ICD-10-CM | POA: Diagnosis not present

## 2020-02-13 DIAGNOSIS — D2271 Melanocytic nevi of right lower limb, including hip: Secondary | ICD-10-CM | POA: Diagnosis not present

## 2020-02-13 DIAGNOSIS — L821 Other seborrheic keratosis: Secondary | ICD-10-CM | POA: Diagnosis not present

## 2020-02-29 DIAGNOSIS — Z1159 Encounter for screening for other viral diseases: Secondary | ICD-10-CM | POA: Diagnosis not present

## 2020-03-06 DIAGNOSIS — Z86010 Personal history of colonic polyps: Secondary | ICD-10-CM | POA: Diagnosis not present

## 2020-03-06 DIAGNOSIS — K648 Other hemorrhoids: Secondary | ICD-10-CM | POA: Diagnosis not present

## 2020-03-06 DIAGNOSIS — D123 Benign neoplasm of transverse colon: Secondary | ICD-10-CM | POA: Diagnosis not present

## 2020-03-06 DIAGNOSIS — Z800 Family history of malignant neoplasm of digestive organs: Secondary | ICD-10-CM | POA: Diagnosis not present

## 2020-03-06 DIAGNOSIS — K5730 Diverticulosis of large intestine without perforation or abscess without bleeding: Secondary | ICD-10-CM | POA: Diagnosis not present

## 2020-03-08 DIAGNOSIS — D123 Benign neoplasm of transverse colon: Secondary | ICD-10-CM | POA: Diagnosis not present

## 2020-03-12 DIAGNOSIS — I10 Essential (primary) hypertension: Secondary | ICD-10-CM | POA: Diagnosis not present

## 2020-04-15 DIAGNOSIS — R69 Illness, unspecified: Secondary | ICD-10-CM | POA: Diagnosis not present

## 2020-06-20 DIAGNOSIS — D72819 Decreased white blood cell count, unspecified: Secondary | ICD-10-CM | POA: Diagnosis not present

## 2020-06-20 DIAGNOSIS — I10 Essential (primary) hypertension: Secondary | ICD-10-CM | POA: Diagnosis not present

## 2020-06-20 DIAGNOSIS — Z0000 Encounter for general adult medical examination without abnormal findings: Secondary | ICD-10-CM | POA: Diagnosis not present

## 2020-06-20 DIAGNOSIS — E785 Hyperlipidemia, unspecified: Secondary | ICD-10-CM | POA: Diagnosis not present

## 2020-06-20 DIAGNOSIS — E559 Vitamin D deficiency, unspecified: Secondary | ICD-10-CM | POA: Diagnosis not present

## 2020-06-20 DIAGNOSIS — K219 Gastro-esophageal reflux disease without esophagitis: Secondary | ICD-10-CM | POA: Diagnosis not present

## 2020-06-20 DIAGNOSIS — Z5181 Encounter for therapeutic drug level monitoring: Secondary | ICD-10-CM | POA: Diagnosis not present

## 2020-06-20 DIAGNOSIS — H9319 Tinnitus, unspecified ear: Secondary | ICD-10-CM | POA: Diagnosis not present

## 2020-08-24 DIAGNOSIS — R69 Illness, unspecified: Secondary | ICD-10-CM | POA: Diagnosis not present

## 2020-09-17 ENCOUNTER — Other Ambulatory Visit: Payer: Self-pay | Admitting: Family Medicine

## 2020-09-17 DIAGNOSIS — Z1231 Encounter for screening mammogram for malignant neoplasm of breast: Secondary | ICD-10-CM

## 2020-09-25 DIAGNOSIS — E785 Hyperlipidemia, unspecified: Secondary | ICD-10-CM | POA: Diagnosis not present

## 2020-09-25 DIAGNOSIS — M810 Age-related osteoporosis without current pathological fracture: Secondary | ICD-10-CM | POA: Diagnosis not present

## 2020-09-25 DIAGNOSIS — Z7982 Long term (current) use of aspirin: Secondary | ICD-10-CM | POA: Diagnosis not present

## 2020-09-25 DIAGNOSIS — K219 Gastro-esophageal reflux disease without esophagitis: Secondary | ICD-10-CM | POA: Diagnosis not present

## 2020-09-25 DIAGNOSIS — Z809 Family history of malignant neoplasm, unspecified: Secondary | ICD-10-CM | POA: Diagnosis not present

## 2020-09-25 DIAGNOSIS — M1990 Unspecified osteoarthritis, unspecified site: Secondary | ICD-10-CM | POA: Diagnosis not present

## 2020-09-25 DIAGNOSIS — I10 Essential (primary) hypertension: Secondary | ICD-10-CM | POA: Diagnosis not present

## 2020-09-25 DIAGNOSIS — R32 Unspecified urinary incontinence: Secondary | ICD-10-CM | POA: Diagnosis not present

## 2020-09-25 DIAGNOSIS — G8929 Other chronic pain: Secondary | ICD-10-CM | POA: Diagnosis not present

## 2020-09-25 DIAGNOSIS — R69 Illness, unspecified: Secondary | ICD-10-CM | POA: Diagnosis not present

## 2020-09-26 DIAGNOSIS — L570 Actinic keratosis: Secondary | ICD-10-CM | POA: Diagnosis not present

## 2020-09-26 DIAGNOSIS — D1801 Hemangioma of skin and subcutaneous tissue: Secondary | ICD-10-CM | POA: Diagnosis not present

## 2020-09-26 DIAGNOSIS — Z85828 Personal history of other malignant neoplasm of skin: Secondary | ICD-10-CM | POA: Diagnosis not present

## 2020-09-26 DIAGNOSIS — L821 Other seborrheic keratosis: Secondary | ICD-10-CM | POA: Diagnosis not present

## 2020-09-26 DIAGNOSIS — D485 Neoplasm of uncertain behavior of skin: Secondary | ICD-10-CM | POA: Diagnosis not present

## 2020-09-26 DIAGNOSIS — C44612 Basal cell carcinoma of skin of right upper limb, including shoulder: Secondary | ICD-10-CM | POA: Diagnosis not present

## 2020-09-26 DIAGNOSIS — C44519 Basal cell carcinoma of skin of other part of trunk: Secondary | ICD-10-CM | POA: Diagnosis not present

## 2020-11-04 ENCOUNTER — Ambulatory Visit
Admission: RE | Admit: 2020-11-04 | Discharge: 2020-11-04 | Disposition: A | Discharge status: Home / Self Care | Payer: Medicare HMO | Source: Ambulatory Visit | Attending: Family Medicine | Admitting: Family Medicine

## 2020-11-04 ENCOUNTER — Other Ambulatory Visit: Payer: Self-pay

## 2020-11-04 DIAGNOSIS — Z1231 Encounter for screening mammogram for malignant neoplasm of breast: Secondary | ICD-10-CM

## 2020-11-14 ENCOUNTER — Other Ambulatory Visit: Payer: Self-pay | Admitting: Family Medicine

## 2020-11-14 DIAGNOSIS — N63 Unspecified lump in unspecified breast: Secondary | ICD-10-CM

## 2020-11-14 DIAGNOSIS — R2231 Localized swelling, mass and lump, right upper limb: Secondary | ICD-10-CM

## 2020-11-28 ENCOUNTER — Ambulatory Visit
Admission: RE | Admit: 2020-11-28 | Discharge: 2020-11-28 | Disposition: A | Discharge status: Home / Self Care | Payer: Medicare HMO | Source: Ambulatory Visit | Attending: Family Medicine | Admitting: Family Medicine

## 2020-11-28 ENCOUNTER — Other Ambulatory Visit: Payer: Self-pay

## 2020-11-28 DIAGNOSIS — R2231 Localized swelling, mass and lump, right upper limb: Secondary | ICD-10-CM

## 2020-11-28 DIAGNOSIS — N6489 Other specified disorders of breast: Secondary | ICD-10-CM | POA: Diagnosis not present

## 2020-11-28 DIAGNOSIS — R922 Inconclusive mammogram: Secondary | ICD-10-CM | POA: Diagnosis not present

## 2020-12-23 DIAGNOSIS — K219 Gastro-esophageal reflux disease without esophagitis: Secondary | ICD-10-CM | POA: Diagnosis not present

## 2020-12-23 DIAGNOSIS — I10 Essential (primary) hypertension: Secondary | ICD-10-CM | POA: Diagnosis not present

## 2020-12-23 DIAGNOSIS — R0981 Nasal congestion: Secondary | ICD-10-CM | POA: Diagnosis not present

## 2020-12-23 DIAGNOSIS — D72819 Decreased white blood cell count, unspecified: Secondary | ICD-10-CM | POA: Diagnosis not present

## 2020-12-23 DIAGNOSIS — E785 Hyperlipidemia, unspecified: Secondary | ICD-10-CM | POA: Diagnosis not present

## 2020-12-30 DIAGNOSIS — Z20822 Contact with and (suspected) exposure to covid-19: Secondary | ICD-10-CM | POA: Diagnosis not present

## 2021-01-28 DIAGNOSIS — H903 Sensorineural hearing loss, bilateral: Secondary | ICD-10-CM | POA: Diagnosis not present

## 2021-01-28 DIAGNOSIS — H9313 Tinnitus, bilateral: Secondary | ICD-10-CM | POA: Diagnosis not present

## 2021-01-28 DIAGNOSIS — H6983 Other specified disorders of Eustachian tube, bilateral: Secondary | ICD-10-CM | POA: Diagnosis not present

## 2021-01-28 DIAGNOSIS — J310 Chronic rhinitis: Secondary | ICD-10-CM | POA: Diagnosis not present

## 2021-02-27 DIAGNOSIS — H524 Presbyopia: Secondary | ICD-10-CM | POA: Diagnosis not present

## 2021-02-27 DIAGNOSIS — E7800 Pure hypercholesterolemia, unspecified: Secondary | ICD-10-CM | POA: Diagnosis not present

## 2021-02-27 DIAGNOSIS — I10 Essential (primary) hypertension: Secondary | ICD-10-CM | POA: Diagnosis not present

## 2021-03-31 DIAGNOSIS — Z85828 Personal history of other malignant neoplasm of skin: Secondary | ICD-10-CM | POA: Diagnosis not present

## 2021-03-31 DIAGNOSIS — D2272 Melanocytic nevi of left lower limb, including hip: Secondary | ICD-10-CM | POA: Diagnosis not present

## 2021-03-31 DIAGNOSIS — L821 Other seborrheic keratosis: Secondary | ICD-10-CM | POA: Diagnosis not present

## 2021-03-31 DIAGNOSIS — L570 Actinic keratosis: Secondary | ICD-10-CM | POA: Diagnosis not present

## 2021-03-31 DIAGNOSIS — L820 Inflamed seborrheic keratosis: Secondary | ICD-10-CM | POA: Diagnosis not present

## 2021-07-01 DIAGNOSIS — I10 Essential (primary) hypertension: Secondary | ICD-10-CM | POA: Diagnosis not present

## 2021-07-01 DIAGNOSIS — D72819 Decreased white blood cell count, unspecified: Secondary | ICD-10-CM | POA: Diagnosis not present

## 2021-07-01 DIAGNOSIS — Z5181 Encounter for therapeutic drug level monitoring: Secondary | ICD-10-CM | POA: Diagnosis not present

## 2021-07-01 DIAGNOSIS — E785 Hyperlipidemia, unspecified: Secondary | ICD-10-CM | POA: Diagnosis not present

## 2021-07-01 DIAGNOSIS — K219 Gastro-esophageal reflux disease without esophagitis: Secondary | ICD-10-CM | POA: Diagnosis not present

## 2021-07-01 DIAGNOSIS — E559 Vitamin D deficiency, unspecified: Secondary | ICD-10-CM | POA: Diagnosis not present

## 2021-07-01 DIAGNOSIS — H9319 Tinnitus, unspecified ear: Secondary | ICD-10-CM | POA: Diagnosis not present

## 2021-07-01 DIAGNOSIS — Z0000 Encounter for general adult medical examination without abnormal findings: Secondary | ICD-10-CM | POA: Diagnosis not present

## 2021-07-24 DIAGNOSIS — M79671 Pain in right foot: Secondary | ICD-10-CM | POA: Diagnosis not present

## 2021-07-24 DIAGNOSIS — M545 Low back pain, unspecified: Secondary | ICD-10-CM | POA: Diagnosis not present

## 2021-07-24 DIAGNOSIS — M5441 Lumbago with sciatica, right side: Secondary | ICD-10-CM | POA: Diagnosis not present

## 2021-10-01 DIAGNOSIS — D2272 Melanocytic nevi of left lower limb, including hip: Secondary | ICD-10-CM | POA: Diagnosis not present

## 2021-10-01 DIAGNOSIS — D2271 Melanocytic nevi of right lower limb, including hip: Secondary | ICD-10-CM | POA: Diagnosis not present

## 2021-10-01 DIAGNOSIS — L570 Actinic keratosis: Secondary | ICD-10-CM | POA: Diagnosis not present

## 2021-10-01 DIAGNOSIS — L738 Other specified follicular disorders: Secondary | ICD-10-CM | POA: Diagnosis not present

## 2021-10-01 DIAGNOSIS — L814 Other melanin hyperpigmentation: Secondary | ICD-10-CM | POA: Diagnosis not present

## 2021-10-01 DIAGNOSIS — K130 Diseases of lips: Secondary | ICD-10-CM | POA: Diagnosis not present

## 2021-10-01 DIAGNOSIS — Z85828 Personal history of other malignant neoplasm of skin: Secondary | ICD-10-CM | POA: Diagnosis not present

## 2021-10-01 DIAGNOSIS — L821 Other seborrheic keratosis: Secondary | ICD-10-CM | POA: Diagnosis not present

## 2021-10-06 DIAGNOSIS — Z7722 Contact with and (suspected) exposure to environmental tobacco smoke (acute) (chronic): Secondary | ICD-10-CM | POA: Diagnosis not present

## 2021-10-06 DIAGNOSIS — Z87891 Personal history of nicotine dependence: Secondary | ICD-10-CM | POA: Diagnosis not present

## 2021-10-06 DIAGNOSIS — M1990 Unspecified osteoarthritis, unspecified site: Secondary | ICD-10-CM | POA: Diagnosis not present

## 2021-10-06 DIAGNOSIS — I10 Essential (primary) hypertension: Secondary | ICD-10-CM | POA: Diagnosis not present

## 2021-10-06 DIAGNOSIS — K219 Gastro-esophageal reflux disease without esophagitis: Secondary | ICD-10-CM | POA: Diagnosis not present

## 2021-10-06 DIAGNOSIS — Z809 Family history of malignant neoplasm, unspecified: Secondary | ICD-10-CM | POA: Diagnosis not present

## 2021-10-06 DIAGNOSIS — Z8249 Family history of ischemic heart disease and other diseases of the circulatory system: Secondary | ICD-10-CM | POA: Diagnosis not present

## 2021-10-06 DIAGNOSIS — Z880 Allergy status to penicillin: Secondary | ICD-10-CM | POA: Diagnosis not present

## 2021-10-06 DIAGNOSIS — Z008 Encounter for other general examination: Secondary | ICD-10-CM | POA: Diagnosis not present

## 2021-10-06 DIAGNOSIS — Z7982 Long term (current) use of aspirin: Secondary | ICD-10-CM | POA: Diagnosis not present

## 2021-10-06 DIAGNOSIS — E785 Hyperlipidemia, unspecified: Secondary | ICD-10-CM | POA: Diagnosis not present

## 2021-12-17 ENCOUNTER — Other Ambulatory Visit: Payer: Self-pay | Admitting: Family Medicine

## 2021-12-17 DIAGNOSIS — Z1231 Encounter for screening mammogram for malignant neoplasm of breast: Secondary | ICD-10-CM

## 2022-01-02 DIAGNOSIS — D72819 Decreased white blood cell count, unspecified: Secondary | ICD-10-CM | POA: Diagnosis not present

## 2022-01-02 DIAGNOSIS — E2839 Other primary ovarian failure: Secondary | ICD-10-CM | POA: Diagnosis not present

## 2022-01-02 DIAGNOSIS — K219 Gastro-esophageal reflux disease without esophagitis: Secondary | ICD-10-CM | POA: Diagnosis not present

## 2022-01-02 DIAGNOSIS — I10 Essential (primary) hypertension: Secondary | ICD-10-CM | POA: Diagnosis not present

## 2022-01-02 DIAGNOSIS — R32 Unspecified urinary incontinence: Secondary | ICD-10-CM | POA: Diagnosis not present

## 2022-01-02 DIAGNOSIS — E785 Hyperlipidemia, unspecified: Secondary | ICD-10-CM | POA: Diagnosis not present

## 2022-01-02 DIAGNOSIS — R221 Localized swelling, mass and lump, neck: Secondary | ICD-10-CM | POA: Diagnosis not present

## 2022-01-06 ENCOUNTER — Ambulatory Visit
Admission: RE | Admit: 2022-01-06 | Discharge: 2022-01-06 | Disposition: A | Discharge status: Home / Self Care | Payer: Medicare HMO | Source: Ambulatory Visit | Attending: Family Medicine | Admitting: Family Medicine

## 2022-01-06 DIAGNOSIS — Z1231 Encounter for screening mammogram for malignant neoplasm of breast: Secondary | ICD-10-CM | POA: Diagnosis not present

## 2022-01-13 ENCOUNTER — Other Ambulatory Visit: Payer: Self-pay | Admitting: Family Medicine

## 2022-01-13 DIAGNOSIS — E2839 Other primary ovarian failure: Secondary | ICD-10-CM

## 2022-03-26 DIAGNOSIS — H524 Presbyopia: Secondary | ICD-10-CM | POA: Diagnosis not present

## 2022-03-26 DIAGNOSIS — I10 Essential (primary) hypertension: Secondary | ICD-10-CM | POA: Diagnosis not present

## 2022-03-26 DIAGNOSIS — Z0100 Encounter for examination of eyes and vision without abnormal findings: Secondary | ICD-10-CM | POA: Diagnosis not present

## 2022-03-31 DIAGNOSIS — L570 Actinic keratosis: Secondary | ICD-10-CM | POA: Diagnosis not present

## 2022-03-31 DIAGNOSIS — Z85828 Personal history of other malignant neoplasm of skin: Secondary | ICD-10-CM | POA: Diagnosis not present

## 2022-03-31 DIAGNOSIS — D2272 Melanocytic nevi of left lower limb, including hip: Secondary | ICD-10-CM | POA: Diagnosis not present

## 2022-03-31 DIAGNOSIS — D225 Melanocytic nevi of trunk: Secondary | ICD-10-CM | POA: Diagnosis not present

## 2022-03-31 DIAGNOSIS — D2271 Melanocytic nevi of right lower limb, including hip: Secondary | ICD-10-CM | POA: Diagnosis not present

## 2022-03-31 DIAGNOSIS — L821 Other seborrheic keratosis: Secondary | ICD-10-CM | POA: Diagnosis not present

## 2022-05-07 DIAGNOSIS — N393 Stress incontinence (female) (male): Secondary | ICD-10-CM | POA: Diagnosis not present

## 2022-05-18 DIAGNOSIS — E785 Hyperlipidemia, unspecified: Secondary | ICD-10-CM | POA: Diagnosis not present

## 2022-05-18 DIAGNOSIS — Z85828 Personal history of other malignant neoplasm of skin: Secondary | ICD-10-CM | POA: Diagnosis not present

## 2022-05-18 DIAGNOSIS — I10 Essential (primary) hypertension: Secondary | ICD-10-CM | POA: Diagnosis not present

## 2022-05-18 DIAGNOSIS — Z7982 Long term (current) use of aspirin: Secondary | ICD-10-CM | POA: Diagnosis not present

## 2022-05-18 DIAGNOSIS — Z803 Family history of malignant neoplasm of breast: Secondary | ICD-10-CM | POA: Diagnosis not present

## 2022-05-18 DIAGNOSIS — M1990 Unspecified osteoarthritis, unspecified site: Secondary | ICD-10-CM | POA: Diagnosis not present

## 2022-05-18 DIAGNOSIS — Z880 Allergy status to penicillin: Secondary | ICD-10-CM | POA: Diagnosis not present

## 2022-05-18 DIAGNOSIS — Z87891 Personal history of nicotine dependence: Secondary | ICD-10-CM | POA: Diagnosis not present

## 2022-05-18 DIAGNOSIS — R69 Illness, unspecified: Secondary | ICD-10-CM | POA: Diagnosis not present

## 2022-05-18 DIAGNOSIS — R32 Unspecified urinary incontinence: Secondary | ICD-10-CM | POA: Diagnosis not present

## 2022-05-18 DIAGNOSIS — Z8249 Family history of ischemic heart disease and other diseases of the circulatory system: Secondary | ICD-10-CM | POA: Diagnosis not present

## 2022-06-29 ENCOUNTER — Other Ambulatory Visit: Payer: Medicare HMO

## 2022-07-14 DIAGNOSIS — K219 Gastro-esophageal reflux disease without esophagitis: Secondary | ICD-10-CM | POA: Diagnosis not present

## 2022-07-14 DIAGNOSIS — Z5181 Encounter for therapeutic drug level monitoring: Secondary | ICD-10-CM | POA: Diagnosis not present

## 2022-07-14 DIAGNOSIS — R490 Dysphonia: Secondary | ICD-10-CM | POA: Diagnosis not present

## 2022-07-14 DIAGNOSIS — H9319 Tinnitus, unspecified ear: Secondary | ICD-10-CM | POA: Diagnosis not present

## 2022-07-14 DIAGNOSIS — M48061 Spinal stenosis, lumbar region without neurogenic claudication: Secondary | ICD-10-CM | POA: Diagnosis not present

## 2022-07-14 DIAGNOSIS — E559 Vitamin D deficiency, unspecified: Secondary | ICD-10-CM | POA: Diagnosis not present

## 2022-07-14 DIAGNOSIS — E785 Hyperlipidemia, unspecified: Secondary | ICD-10-CM | POA: Diagnosis not present

## 2022-07-14 DIAGNOSIS — I10 Essential (primary) hypertension: Secondary | ICD-10-CM | POA: Diagnosis not present

## 2022-07-14 DIAGNOSIS — D72819 Decreased white blood cell count, unspecified: Secondary | ICD-10-CM | POA: Diagnosis not present

## 2022-07-14 DIAGNOSIS — Z0000 Encounter for general adult medical examination without abnormal findings: Secondary | ICD-10-CM | POA: Diagnosis not present

## 2022-08-14 DIAGNOSIS — M4186 Other forms of scoliosis, lumbar region: Secondary | ICD-10-CM | POA: Diagnosis not present

## 2022-08-14 DIAGNOSIS — M48062 Spinal stenosis, lumbar region with neurogenic claudication: Secondary | ICD-10-CM | POA: Diagnosis not present

## 2022-08-14 DIAGNOSIS — M5416 Radiculopathy, lumbar region: Secondary | ICD-10-CM | POA: Diagnosis not present

## 2022-08-14 DIAGNOSIS — M48061 Spinal stenosis, lumbar region without neurogenic claudication: Secondary | ICD-10-CM | POA: Diagnosis not present

## 2022-08-14 DIAGNOSIS — M4316 Spondylolisthesis, lumbar region: Secondary | ICD-10-CM | POA: Diagnosis not present

## 2022-08-14 DIAGNOSIS — R29818 Other symptoms and signs involving the nervous system: Secondary | ICD-10-CM | POA: Diagnosis not present

## 2022-08-14 DIAGNOSIS — M5136 Other intervertebral disc degeneration, lumbar region: Secondary | ICD-10-CM | POA: Diagnosis not present

## 2022-08-14 DIAGNOSIS — M8588 Other specified disorders of bone density and structure, other site: Secondary | ICD-10-CM | POA: Diagnosis not present

## 2022-09-23 DIAGNOSIS — R29818 Other symptoms and signs involving the nervous system: Secondary | ICD-10-CM | POA: Diagnosis not present

## 2022-09-23 DIAGNOSIS — M4726 Other spondylosis with radiculopathy, lumbar region: Secondary | ICD-10-CM | POA: Diagnosis not present

## 2022-09-23 DIAGNOSIS — M4316 Spondylolisthesis, lumbar region: Secondary | ICD-10-CM | POA: Diagnosis not present

## 2022-09-23 DIAGNOSIS — M48061 Spinal stenosis, lumbar region without neurogenic claudication: Secondary | ICD-10-CM | POA: Diagnosis not present

## 2022-09-23 DIAGNOSIS — M6281 Muscle weakness (generalized): Secondary | ICD-10-CM | POA: Diagnosis not present

## 2022-10-01 DIAGNOSIS — D2262 Melanocytic nevi of left upper limb, including shoulder: Secondary | ICD-10-CM | POA: Diagnosis not present

## 2022-10-01 DIAGNOSIS — L821 Other seborrheic keratosis: Secondary | ICD-10-CM | POA: Diagnosis not present

## 2022-10-01 DIAGNOSIS — L738 Other specified follicular disorders: Secondary | ICD-10-CM | POA: Diagnosis not present

## 2022-10-01 DIAGNOSIS — D225 Melanocytic nevi of trunk: Secondary | ICD-10-CM | POA: Diagnosis not present

## 2022-10-01 DIAGNOSIS — L814 Other melanin hyperpigmentation: Secondary | ICD-10-CM | POA: Diagnosis not present

## 2022-10-01 DIAGNOSIS — L570 Actinic keratosis: Secondary | ICD-10-CM | POA: Diagnosis not present

## 2022-10-01 DIAGNOSIS — L820 Inflamed seborrheic keratosis: Secondary | ICD-10-CM | POA: Diagnosis not present

## 2022-10-01 DIAGNOSIS — Z85828 Personal history of other malignant neoplasm of skin: Secondary | ICD-10-CM | POA: Diagnosis not present

## 2022-10-01 DIAGNOSIS — I788 Other diseases of capillaries: Secondary | ICD-10-CM | POA: Diagnosis not present

## 2022-10-05 ENCOUNTER — Other Ambulatory Visit: Payer: Medicare HMO

## 2022-10-05 ENCOUNTER — Other Ambulatory Visit: Payer: Self-pay | Admitting: Family Medicine

## 2022-10-05 DIAGNOSIS — E2839 Other primary ovarian failure: Secondary | ICD-10-CM

## 2022-10-15 DIAGNOSIS — R29818 Other symptoms and signs involving the nervous system: Secondary | ICD-10-CM | POA: Diagnosis not present

## 2022-10-15 DIAGNOSIS — M6281 Muscle weakness (generalized): Secondary | ICD-10-CM | POA: Diagnosis not present

## 2022-10-15 DIAGNOSIS — M48061 Spinal stenosis, lumbar region without neurogenic claudication: Secondary | ICD-10-CM | POA: Diagnosis not present

## 2022-10-15 DIAGNOSIS — M4316 Spondylolisthesis, lumbar region: Secondary | ICD-10-CM | POA: Diagnosis not present

## 2022-10-15 DIAGNOSIS — M4726 Other spondylosis with radiculopathy, lumbar region: Secondary | ICD-10-CM | POA: Diagnosis not present

## 2022-12-23 DIAGNOSIS — M5416 Radiculopathy, lumbar region: Secondary | ICD-10-CM | POA: Diagnosis not present

## 2022-12-23 DIAGNOSIS — R29818 Other symptoms and signs involving the nervous system: Secondary | ICD-10-CM | POA: Diagnosis not present

## 2022-12-23 DIAGNOSIS — M48061 Spinal stenosis, lumbar region without neurogenic claudication: Secondary | ICD-10-CM | POA: Diagnosis not present

## 2022-12-23 DIAGNOSIS — M6281 Muscle weakness (generalized): Secondary | ICD-10-CM | POA: Diagnosis not present

## 2022-12-23 DIAGNOSIS — M4316 Spondylolisthesis, lumbar region: Secondary | ICD-10-CM | POA: Diagnosis not present

## 2022-12-29 ENCOUNTER — Other Ambulatory Visit: Payer: Self-pay | Admitting: Family Medicine

## 2022-12-29 DIAGNOSIS — Z1231 Encounter for screening mammogram for malignant neoplasm of breast: Secondary | ICD-10-CM

## 2023-01-04 DIAGNOSIS — M4316 Spondylolisthesis, lumbar region: Secondary | ICD-10-CM | POA: Diagnosis not present

## 2023-01-04 DIAGNOSIS — R29818 Other symptoms and signs involving the nervous system: Secondary | ICD-10-CM | POA: Diagnosis not present

## 2023-01-04 DIAGNOSIS — M6281 Muscle weakness (generalized): Secondary | ICD-10-CM | POA: Diagnosis not present

## 2023-01-04 DIAGNOSIS — M5416 Radiculopathy, lumbar region: Secondary | ICD-10-CM | POA: Diagnosis not present

## 2023-01-04 DIAGNOSIS — M48061 Spinal stenosis, lumbar region without neurogenic claudication: Secondary | ICD-10-CM | POA: Diagnosis not present

## 2023-01-05 DIAGNOSIS — R221 Localized swelling, mass and lump, neck: Secondary | ICD-10-CM | POA: Diagnosis not present

## 2023-01-06 ENCOUNTER — Other Ambulatory Visit: Payer: Self-pay | Admitting: Otolaryngology

## 2023-01-06 DIAGNOSIS — R221 Localized swelling, mass and lump, neck: Secondary | ICD-10-CM

## 2023-01-14 DIAGNOSIS — M4316 Spondylolisthesis, lumbar region: Secondary | ICD-10-CM | POA: Diagnosis not present

## 2023-01-14 DIAGNOSIS — E2839 Other primary ovarian failure: Secondary | ICD-10-CM | POA: Diagnosis not present

## 2023-01-14 DIAGNOSIS — R32 Unspecified urinary incontinence: Secondary | ICD-10-CM | POA: Diagnosis not present

## 2023-01-14 DIAGNOSIS — M48061 Spinal stenosis, lumbar region without neurogenic claudication: Secondary | ICD-10-CM | POA: Diagnosis not present

## 2023-01-14 DIAGNOSIS — K219 Gastro-esophageal reflux disease without esophagitis: Secondary | ICD-10-CM | POA: Diagnosis not present

## 2023-01-14 DIAGNOSIS — R29818 Other symptoms and signs involving the nervous system: Secondary | ICD-10-CM | POA: Diagnosis not present

## 2023-01-14 DIAGNOSIS — M5416 Radiculopathy, lumbar region: Secondary | ICD-10-CM | POA: Diagnosis not present

## 2023-01-14 DIAGNOSIS — N898 Other specified noninflammatory disorders of vagina: Secondary | ICD-10-CM | POA: Diagnosis not present

## 2023-01-14 DIAGNOSIS — I10 Essential (primary) hypertension: Secondary | ICD-10-CM | POA: Diagnosis not present

## 2023-01-14 DIAGNOSIS — E785 Hyperlipidemia, unspecified: Secondary | ICD-10-CM | POA: Diagnosis not present

## 2023-01-14 DIAGNOSIS — D72819 Decreased white blood cell count, unspecified: Secondary | ICD-10-CM | POA: Diagnosis not present

## 2023-01-14 DIAGNOSIS — M5430 Sciatica, unspecified side: Secondary | ICD-10-CM | POA: Diagnosis not present

## 2023-01-14 DIAGNOSIS — M6281 Muscle weakness (generalized): Secondary | ICD-10-CM | POA: Diagnosis not present

## 2023-01-21 DIAGNOSIS — M6281 Muscle weakness (generalized): Secondary | ICD-10-CM | POA: Diagnosis not present

## 2023-01-21 DIAGNOSIS — M48061 Spinal stenosis, lumbar region without neurogenic claudication: Secondary | ICD-10-CM | POA: Diagnosis not present

## 2023-01-21 DIAGNOSIS — M4316 Spondylolisthesis, lumbar region: Secondary | ICD-10-CM | POA: Diagnosis not present

## 2023-01-21 DIAGNOSIS — R29818 Other symptoms and signs involving the nervous system: Secondary | ICD-10-CM | POA: Diagnosis not present

## 2023-01-21 DIAGNOSIS — M5416 Radiculopathy, lumbar region: Secondary | ICD-10-CM | POA: Diagnosis not present

## 2023-01-25 ENCOUNTER — Ambulatory Visit
Admission: RE | Admit: 2023-01-25 | Discharge: 2023-01-25 | Disposition: A | Discharge status: Home / Self Care | Payer: Medicare HMO | Source: Ambulatory Visit | Attending: Otolaryngology | Admitting: Otolaryngology

## 2023-01-25 DIAGNOSIS — R221 Localized swelling, mass and lump, neck: Secondary | ICD-10-CM | POA: Diagnosis not present

## 2023-02-11 DIAGNOSIS — M4316 Spondylolisthesis, lumbar region: Secondary | ICD-10-CM | POA: Diagnosis not present

## 2023-02-11 DIAGNOSIS — R29818 Other symptoms and signs involving the nervous system: Secondary | ICD-10-CM | POA: Diagnosis not present

## 2023-02-11 DIAGNOSIS — M47816 Spondylosis without myelopathy or radiculopathy, lumbar region: Secondary | ICD-10-CM | POA: Diagnosis not present

## 2023-02-11 DIAGNOSIS — M4310 Spondylolisthesis, site unspecified: Secondary | ICD-10-CM | POA: Diagnosis not present

## 2023-02-11 DIAGNOSIS — M48061 Spinal stenosis, lumbar region without neurogenic claudication: Secondary | ICD-10-CM | POA: Diagnosis not present

## 2023-02-11 DIAGNOSIS — M5416 Radiculopathy, lumbar region: Secondary | ICD-10-CM | POA: Diagnosis not present

## 2023-03-02 DIAGNOSIS — M4316 Spondylolisthesis, lumbar region: Secondary | ICD-10-CM | POA: Diagnosis not present

## 2023-03-02 DIAGNOSIS — M48062 Spinal stenosis, lumbar region with neurogenic claudication: Secondary | ICD-10-CM | POA: Diagnosis not present

## 2023-03-22 ENCOUNTER — Ambulatory Visit
Admission: RE | Admit: 2023-03-22 | Discharge: 2023-03-22 | Disposition: A | Discharge status: Home / Self Care | Payer: Medicare HMO | Source: Ambulatory Visit | Attending: Family Medicine | Admitting: Family Medicine

## 2023-03-22 DIAGNOSIS — M8589 Other specified disorders of bone density and structure, multiple sites: Secondary | ICD-10-CM | POA: Diagnosis not present

## 2023-03-22 DIAGNOSIS — Z1231 Encounter for screening mammogram for malignant neoplasm of breast: Secondary | ICD-10-CM | POA: Diagnosis not present

## 2023-03-22 DIAGNOSIS — Z780 Asymptomatic menopausal state: Secondary | ICD-10-CM | POA: Diagnosis not present

## 2023-03-22 DIAGNOSIS — E2839 Other primary ovarian failure: Secondary | ICD-10-CM

## 2023-03-29 DIAGNOSIS — H524 Presbyopia: Secondary | ICD-10-CM | POA: Diagnosis not present

## 2023-03-31 DIAGNOSIS — M4800 Spinal stenosis, site unspecified: Secondary | ICD-10-CM | POA: Diagnosis not present

## 2023-03-31 DIAGNOSIS — I10 Essential (primary) hypertension: Secondary | ICD-10-CM | POA: Diagnosis not present

## 2023-03-31 DIAGNOSIS — Z008 Encounter for other general examination: Secondary | ICD-10-CM | POA: Diagnosis not present

## 2023-03-31 DIAGNOSIS — Z85828 Personal history of other malignant neoplasm of skin: Secondary | ICD-10-CM | POA: Diagnosis not present

## 2023-03-31 DIAGNOSIS — N393 Stress incontinence (female) (male): Secondary | ICD-10-CM | POA: Diagnosis not present

## 2023-03-31 DIAGNOSIS — M810 Age-related osteoporosis without current pathological fracture: Secondary | ICD-10-CM | POA: Diagnosis not present

## 2023-03-31 DIAGNOSIS — M1990 Unspecified osteoarthritis, unspecified site: Secondary | ICD-10-CM | POA: Diagnosis not present

## 2023-03-31 DIAGNOSIS — E785 Hyperlipidemia, unspecified: Secondary | ICD-10-CM | POA: Diagnosis not present

## 2023-03-31 DIAGNOSIS — Z87891 Personal history of nicotine dependence: Secondary | ICD-10-CM | POA: Diagnosis not present

## 2023-03-31 DIAGNOSIS — K219 Gastro-esophageal reflux disease without esophagitis: Secondary | ICD-10-CM | POA: Diagnosis not present

## 2023-03-31 DIAGNOSIS — M5440 Lumbago with sciatica, unspecified side: Secondary | ICD-10-CM | POA: Diagnosis not present

## 2023-03-31 DIAGNOSIS — Z880 Allergy status to penicillin: Secondary | ICD-10-CM | POA: Diagnosis not present

## 2023-04-01 DIAGNOSIS — L821 Other seborrheic keratosis: Secondary | ICD-10-CM | POA: Diagnosis not present

## 2023-04-01 DIAGNOSIS — C4441 Basal cell carcinoma of skin of scalp and neck: Secondary | ICD-10-CM | POA: Diagnosis not present

## 2023-04-01 DIAGNOSIS — Z85828 Personal history of other malignant neoplasm of skin: Secondary | ICD-10-CM | POA: Diagnosis not present

## 2023-04-01 DIAGNOSIS — L723 Sebaceous cyst: Secondary | ICD-10-CM | POA: Diagnosis not present

## 2023-04-01 DIAGNOSIS — D485 Neoplasm of uncertain behavior of skin: Secondary | ICD-10-CM | POA: Diagnosis not present

## 2023-04-01 DIAGNOSIS — L718 Other rosacea: Secondary | ICD-10-CM | POA: Diagnosis not present

## 2023-04-28 DIAGNOSIS — K219 Gastro-esophageal reflux disease without esophagitis: Secondary | ICD-10-CM | POA: Diagnosis not present

## 2023-04-28 DIAGNOSIS — Z87891 Personal history of nicotine dependence: Secondary | ICD-10-CM | POA: Diagnosis not present

## 2023-04-28 DIAGNOSIS — E785 Hyperlipidemia, unspecified: Secondary | ICD-10-CM | POA: Diagnosis not present

## 2023-04-28 DIAGNOSIS — M48061 Spinal stenosis, lumbar region without neurogenic claudication: Secondary | ICD-10-CM | POA: Diagnosis not present

## 2023-04-28 DIAGNOSIS — I10 Essential (primary) hypertension: Secondary | ICD-10-CM | POA: Diagnosis not present

## 2023-05-05 DIAGNOSIS — Z8249 Family history of ischemic heart disease and other diseases of the circulatory system: Secondary | ICD-10-CM | POA: Diagnosis not present

## 2023-05-05 DIAGNOSIS — Z833 Family history of diabetes mellitus: Secondary | ICD-10-CM | POA: Diagnosis not present

## 2023-05-05 DIAGNOSIS — I10 Essential (primary) hypertension: Secondary | ICD-10-CM | POA: Diagnosis not present

## 2023-05-05 DIAGNOSIS — M48061 Spinal stenosis, lumbar region without neurogenic claudication: Secondary | ICD-10-CM | POA: Diagnosis not present

## 2023-05-05 DIAGNOSIS — Z818 Family history of other mental and behavioral disorders: Secondary | ICD-10-CM | POA: Diagnosis not present

## 2023-05-05 DIAGNOSIS — M5416 Radiculopathy, lumbar region: Secondary | ICD-10-CM | POA: Diagnosis not present

## 2023-05-05 DIAGNOSIS — Z5189 Encounter for other specified aftercare: Secondary | ICD-10-CM | POA: Diagnosis not present

## 2023-05-05 DIAGNOSIS — M4316 Spondylolisthesis, lumbar region: Secondary | ICD-10-CM | POA: Diagnosis not present

## 2023-05-05 DIAGNOSIS — M48062 Spinal stenosis, lumbar region with neurogenic claudication: Secondary | ICD-10-CM | POA: Diagnosis not present

## 2023-05-05 DIAGNOSIS — E785 Hyperlipidemia, unspecified: Secondary | ICD-10-CM | POA: Diagnosis not present

## 2023-05-05 DIAGNOSIS — Z87891 Personal history of nicotine dependence: Secondary | ICD-10-CM | POA: Diagnosis not present

## 2023-05-05 DIAGNOSIS — K219 Gastro-esophageal reflux disease without esophagitis: Secondary | ICD-10-CM | POA: Diagnosis not present

## 2023-05-05 DIAGNOSIS — Z809 Family history of malignant neoplasm, unspecified: Secondary | ICD-10-CM | POA: Diagnosis not present

## 2023-05-06 DIAGNOSIS — Z5189 Encounter for other specified aftercare: Secondary | ICD-10-CM | POA: Diagnosis not present

## 2023-05-20 DIAGNOSIS — Z4802 Encounter for removal of sutures: Secondary | ICD-10-CM | POA: Diagnosis not present

## 2023-05-20 DIAGNOSIS — Z4789 Encounter for other orthopedic aftercare: Secondary | ICD-10-CM | POA: Diagnosis not present

## 2023-05-20 DIAGNOSIS — Z9889 Other specified postprocedural states: Secondary | ICD-10-CM | POA: Diagnosis not present

## 2023-06-01 DIAGNOSIS — Z4789 Encounter for other orthopedic aftercare: Secondary | ICD-10-CM | POA: Diagnosis not present

## 2023-06-01 DIAGNOSIS — Z981 Arthrodesis status: Secondary | ICD-10-CM | POA: Diagnosis not present

## 2023-06-01 DIAGNOSIS — Z9889 Other specified postprocedural states: Secondary | ICD-10-CM | POA: Diagnosis not present

## 2023-07-23 DIAGNOSIS — H9319 Tinnitus, unspecified ear: Secondary | ICD-10-CM | POA: Diagnosis not present

## 2023-07-23 DIAGNOSIS — E559 Vitamin D deficiency, unspecified: Secondary | ICD-10-CM | POA: Diagnosis not present

## 2023-07-23 DIAGNOSIS — Z5181 Encounter for therapeutic drug level monitoring: Secondary | ICD-10-CM | POA: Diagnosis not present

## 2023-07-23 DIAGNOSIS — K219 Gastro-esophageal reflux disease without esophagitis: Secondary | ICD-10-CM | POA: Diagnosis not present

## 2023-07-23 DIAGNOSIS — E785 Hyperlipidemia, unspecified: Secondary | ICD-10-CM | POA: Diagnosis not present

## 2023-07-23 DIAGNOSIS — D72819 Decreased white blood cell count, unspecified: Secondary | ICD-10-CM | POA: Diagnosis not present

## 2023-07-23 DIAGNOSIS — I10 Essential (primary) hypertension: Secondary | ICD-10-CM | POA: Diagnosis not present

## 2023-07-23 DIAGNOSIS — Z0000 Encounter for general adult medical examination without abnormal findings: Secondary | ICD-10-CM | POA: Diagnosis not present

## 2023-07-23 DIAGNOSIS — R7309 Other abnormal glucose: Secondary | ICD-10-CM | POA: Diagnosis not present

## 2023-08-03 DIAGNOSIS — Z981 Arthrodesis status: Secondary | ICD-10-CM | POA: Diagnosis not present

## 2023-08-03 DIAGNOSIS — Z4789 Encounter for other orthopedic aftercare: Secondary | ICD-10-CM | POA: Diagnosis not present

## 2023-08-03 DIAGNOSIS — Z9889 Other specified postprocedural states: Secondary | ICD-10-CM | POA: Diagnosis not present

## 2023-08-09 DIAGNOSIS — C44319 Basal cell carcinoma of skin of other parts of face: Secondary | ICD-10-CM | POA: Diagnosis not present

## 2023-10-11 DIAGNOSIS — L821 Other seborrheic keratosis: Secondary | ICD-10-CM | POA: Diagnosis not present

## 2023-10-11 DIAGNOSIS — L570 Actinic keratosis: Secondary | ICD-10-CM | POA: Diagnosis not present

## 2023-10-11 DIAGNOSIS — D225 Melanocytic nevi of trunk: Secondary | ICD-10-CM | POA: Diagnosis not present

## 2023-10-11 DIAGNOSIS — Z85828 Personal history of other malignant neoplasm of skin: Secondary | ICD-10-CM | POA: Diagnosis not present

## 2024-01-27 DIAGNOSIS — D72819 Decreased white blood cell count, unspecified: Secondary | ICD-10-CM | POA: Diagnosis not present

## 2024-01-27 DIAGNOSIS — R0602 Shortness of breath: Secondary | ICD-10-CM | POA: Diagnosis not present

## 2024-01-27 DIAGNOSIS — E785 Hyperlipidemia, unspecified: Secondary | ICD-10-CM | POA: Diagnosis not present

## 2024-03-03 ENCOUNTER — Other Ambulatory Visit: Payer: Self-pay | Admitting: Family Medicine

## 2024-03-03 DIAGNOSIS — Z1231 Encounter for screening mammogram for malignant neoplasm of breast: Secondary | ICD-10-CM

## 2024-03-06 ENCOUNTER — Ambulatory Visit (INDEPENDENT_AMBULATORY_CARE_PROVIDER_SITE_OTHER): Payer: Medicare HMO | Admitting: Otolaryngology

## 2024-03-06 ENCOUNTER — Encounter (INDEPENDENT_AMBULATORY_CARE_PROVIDER_SITE_OTHER): Payer: Self-pay

## 2024-03-06 VITALS — BP 132/73 | HR 87 | Ht 63.5 in | Wt 148.0 lb

## 2024-03-06 DIAGNOSIS — H9042 Sensorineural hearing loss, unilateral, left ear, with unrestricted hearing on the contralateral side: Secondary | ICD-10-CM | POA: Diagnosis not present

## 2024-03-06 DIAGNOSIS — H9312 Tinnitus, left ear: Secondary | ICD-10-CM | POA: Insufficient documentation

## 2024-03-06 NOTE — Progress Notes (Unsigned)
 Patient ID: Lauren Martinez, female   DOB: 10-Aug-1951, 73 y.o.   MRN: [id number]  CC: Asymmetric tinnitus and hearing loss  HPI:  Lauren Martinez is a 73 y.o. female who presents today complaining of asymmetric left ear tinnitus and hearing loss.  According to the patient, she has been symptomatic for 6 years.  She was seen in [facility] 5 years ago.  At that time, she was noted to have asymmetric left ear high-frequency sensorineural hearing loss.  Her decision at that time was to proceed with conservative observation.  She has no previous MRI scan.  The patient presents today complaining of persistent left ear tinnitus.  The tinnitus is describes as a constant nonpulsatile static noise.  She has not noted any recent change in her hearing.  She denies any otalgia, otorrhea, or vertigo.  She has a history of migraine headaches.  She has no previous ENT surgery.  She reports occasional environmental allergies.  She uses over-the-counter allergy medications as needed.  Past Medical History:  Diagnosis Date   GERD (gastroesophageal reflux disease)    Hyperlipidemia    Hypertension    Spinal stenosis at L4-L5 level    Spondylisthesis     Past Surgical History:  Procedure Laterality Date   ABDOMINAL HYSTERECTOMY  1990   BREAST BIOPSY     BREAST EXCISIONAL BIOPSY Left     Family History  Problem Relation Age of Onset   Breast cancer Mother     Social History:  reports that she has quit smoking. She has never used smokeless tobacco. She reports current alcohol use. She reports that she does not use drugs.  Allergies:  Allergies  Allergen Reactions   Penicillins Itching    Prior to Admission medications   Medication Sig Start Date End Date Taking? Authorizing Provider  aspirin EC 81 MG tablet Take 81 mg by mouth daily.   Yes [provider]  calcium carbonate 1250 MG capsule Take 1,250 mg by mouth 2 (two) times daily with a meal.   Yes [provider]  losartan  (COZAAR) 50 MG tablet Take 100 mg by mouth daily.    Yes [provider]  Multiple Vitamins-Minerals (MULTIVITAMIN WITH MINERALS) tablet Take 1 tablet by mouth daily.   Yes [provider]  Omega-3 Fatty Acids (FISH OIL) 1200 MG CAPS Take 1 capsule by mouth daily.   Yes [provider]  pantoprazole (PROTONIX) 40 MG tablet Take 40 mg by mouth daily.   Yes [provider]  pravastatin (PRAVACHOL) 20 MG tablet Take 20 mg by mouth daily.   Yes [provider]  alendronate (FOSAMAX) 70 MG tablet Take 70 mg by mouth once a week. Take with a full glass of water on an empty stomach.    [provider]  naproxen sodium (ANAPROX) 220 MG tablet Take 220 mg by mouth 2 (two) times daily with a meal.    [provider]    Blood pressure 132/73, pulse 87, height 5' 3.5" (1.613 m), weight 148 lb (67.1 kg), SpO2 95%. Exam: General: Communicates without difficulty, well nourished, no acute distress. Head: Normocephalic, no evidence injury, no tenderness, facial buttresses intact without stepoff. Face/sinus: No tenderness to palpation and percussion. Facial movement is normal and symmetric. Eyes: PERRL, EOMI. No scleral icterus, conjunctivae clear. Neuro: CN II exam reveals vision grossly intact.  No nystagmus at any point of gaze. Ears: Auricles well formed without lesions.  Ear canals are intact without mass or  lesion.  No erythema or edema is appreciated.  The TMs are intact without fluid. Nose: External evaluation reveals normal support and skin without lesions.  Dorsum is intact.  Anterior rhinoscopy reveals congested mucosa over anterior aspect of inferior turbinates and intact septum.  No purulence noted. Oral:  Oral cavity and oropharynx are intact, symmetric, without erythema or edema.  Mucosa is moist without lesions. Neck: Full range of motion without pain.  There is no significant lymphadenopathy.  No masses palpable.  Thyroid bed within normal  limits to palpation.  Parotid glands and submandibular glands equal bilaterally without mass.  Trachea is midline. Neuro:  CN 2-12 grossly intact.   Assessment: 1.  Persistent left ear tinnitus for the past 6 years. 2.  Asymmetric left high-frequency sensorineural hearing loss. 3.  Her ear canals, tympanic membranes, and middle ear spaces are all normal.  Plan: 1.  The physical exam findings are reviewed with the patient. 2.  The small possibility of a retrocochlear lesion causing the asymmetric hearing loss is discussed.  The options of conservative observation versus MRI scan are reviewed.  The patient would like to proceed with the MRI scan. 3.  The strategies to cope with tinnitus, including the use of masker, hearing aids, tinnitus retraining therapy, and avoidance of caffeine and alcohol are discussed. 4.  The patient will return for reevaluation after her MRI scan.  Naevia Unterreiner W Baila Rouse 03/06/2024, 1:02 PM

## 2024-03-22 ENCOUNTER — Ambulatory Visit
Admission: RE | Admit: 2024-03-22 | Discharge: 2024-03-22 | Disposition: A | Discharge status: Home / Self Care | Source: Ambulatory Visit | Attending: Family Medicine | Admitting: Family Medicine

## 2024-03-22 DIAGNOSIS — Z1231 Encounter for screening mammogram for malignant neoplasm of breast: Secondary | ICD-10-CM

## 2024-03-27 ENCOUNTER — Ambulatory Visit (HOSPITAL_COMMUNITY)
Admission: RE | Admit: 2024-03-27 | Discharge: 2024-03-27 | Disposition: A | Discharge status: Home / Self Care | Source: Ambulatory Visit | Attending: Otolaryngology | Admitting: Otolaryngology

## 2024-03-27 DIAGNOSIS — H9312 Tinnitus, left ear: Secondary | ICD-10-CM | POA: Diagnosis not present

## 2024-03-27 DIAGNOSIS — H9042 Sensorineural hearing loss, unilateral, left ear, with unrestricted hearing on the contralateral side: Secondary | ICD-10-CM | POA: Insufficient documentation

## 2024-03-27 DIAGNOSIS — H905 Unspecified sensorineural hearing loss: Secondary | ICD-10-CM | POA: Diagnosis not present

## 2024-03-27 MED ORDER — GADOBUTROL 1 MMOL/ML IV SOLN
7.0000 mL | Freq: Once | INTRAVENOUS | Status: AC | PRN
  Administered 2024-03-27: 7 mL via INTRAVENOUS

## 2024-03-29 DIAGNOSIS — Z0100 Encounter for examination of eyes and vision without abnormal findings: Secondary | ICD-10-CM | POA: Diagnosis not present

## 2024-03-29 DIAGNOSIS — H35363 Drusen (degenerative) of macula, bilateral: Secondary | ICD-10-CM | POA: Diagnosis not present

## 2024-03-29 DIAGNOSIS — H52223 Regular astigmatism, bilateral: Secondary | ICD-10-CM | POA: Diagnosis not present

## 2024-03-29 DIAGNOSIS — H2513 Age-related nuclear cataract, bilateral: Secondary | ICD-10-CM | POA: Diagnosis not present

## 2024-03-29 DIAGNOSIS — H524 Presbyopia: Secondary | ICD-10-CM | POA: Diagnosis not present

## 2024-03-29 DIAGNOSIS — D3131 Benign neoplasm of right choroid: Secondary | ICD-10-CM | POA: Diagnosis not present

## 2024-03-31 DIAGNOSIS — E785 Hyperlipidemia, unspecified: Secondary | ICD-10-CM | POA: Diagnosis not present

## 2024-04-10 DIAGNOSIS — D485 Neoplasm of uncertain behavior of skin: Secondary | ICD-10-CM | POA: Diagnosis not present

## 2024-04-10 DIAGNOSIS — L723 Sebaceous cyst: Secondary | ICD-10-CM | POA: Diagnosis not present

## 2024-04-10 DIAGNOSIS — L739 Follicular disorder, unspecified: Secondary | ICD-10-CM | POA: Diagnosis not present

## 2024-04-10 DIAGNOSIS — L814 Other melanin hyperpigmentation: Secondary | ICD-10-CM | POA: Diagnosis not present

## 2024-04-10 DIAGNOSIS — L570 Actinic keratosis: Secondary | ICD-10-CM | POA: Diagnosis not present

## 2024-04-10 DIAGNOSIS — D225 Melanocytic nevi of trunk: Secondary | ICD-10-CM | POA: Diagnosis not present

## 2024-04-10 DIAGNOSIS — L821 Other seborrheic keratosis: Secondary | ICD-10-CM | POA: Diagnosis not present

## 2024-04-10 DIAGNOSIS — Z85828 Personal history of other malignant neoplasm of skin: Secondary | ICD-10-CM | POA: Diagnosis not present

## 2024-04-10 DIAGNOSIS — L905 Scar conditions and fibrosis of skin: Secondary | ICD-10-CM | POA: Diagnosis not present

## 2024-04-19 ENCOUNTER — Encounter (INDEPENDENT_AMBULATORY_CARE_PROVIDER_SITE_OTHER): Payer: Self-pay | Admitting: Otolaryngology

## 2024-04-19 ENCOUNTER — Ambulatory Visit (INDEPENDENT_AMBULATORY_CARE_PROVIDER_SITE_OTHER): Admitting: Otolaryngology

## 2024-04-19 VITALS — BP 150/67 | HR 70 | Ht 63.5 in | Wt 149.0 lb

## 2024-04-19 DIAGNOSIS — H9312 Tinnitus, left ear: Secondary | ICD-10-CM

## 2024-04-19 DIAGNOSIS — H9042 Sensorineural hearing loss, unilateral, left ear, with unrestricted hearing on the contralateral side: Secondary | ICD-10-CM

## 2024-04-20 NOTE — Progress Notes (Signed)
 Patient ID: Lauren Martinez, female   DOB: 1951/05/06, 73 y.o.   MRN: [id number]  Follow-up: Asymmetric left ear hearing loss and tinnitus  HPI: The patient is a 73 year old female who returns today for her follow-up evaluation.  She was last seen 1 month ago.  At that time, she was complaining of left ear hearing loss and tinnitus.  She described her tinnitus as a constant nonpulsatile static noise.  Her ear canals, tympanic membranes, and middle ear spaces were normal.  She subsequently underwent an MRI scan.  The MRI was negative for any retrocochlear lesion.  The patient returns today complaining of persistent tinnitus.  She denies any otalgia, otorrhea, or vertigo.  She also denies any change in her hearing.  Exam: General: Communicates without difficulty, well nourished, no acute distress. Head: Normocephalic, no evidence injury, no tenderness, facial buttresses intact without stepoff. Face/sinus: No tenderness to palpation and percussion. Facial movement is normal and symmetric. Eyes: PERRL, EOMI. No scleral icterus, conjunctivae clear. Neuro: CN II exam reveals vision grossly intact.  No nystagmus at any point of gaze. Ears: Auricles well formed without lesions.  Ear canals are intact without mass or lesion.  No erythema or edema is appreciated.  The TMs are intact without fluid. Nose: External evaluation reveals normal support and skin without lesions.  Dorsum is intact.  Anterior rhinoscopy reveals normal mucosa over anterior aspect of inferior turbinates and intact septum.  No purulence noted. Oral:  Oral cavity and oropharynx are intact, symmetric, without erythema or edema.  Mucosa is moist without lesions. Neck: Full range of motion without pain.  There is no significant lymphadenopathy.  No masses palpable.  Thyroid  bed within normal limits to palpation.  Parotid glands and submandibular glands equal bilaterally without mass.  Trachea is midline. Neuro:  CN 2-12 grossly intact.   Assessment: 1.   Asymmetric left ear high-frequency sensorineural hearing loss and tinnitus. 2.  Her ear canals, tympanic membranes, and middle ear spaces are normal. 3.  Her MRI scan is negative for any retrocochlear lesion.  Plan: 1.  The physical exam findings and the MRI results are reviewed with the patient. 2.  The strategies to cope with tinnitus, including the use of masker, hearing aids, tinnitus retraining therapy, and avoidance of caffeine and alcohol are discussed. 3.  The patient is a candidate for hearing amplification.  The hearing aid options are discussed. 4.  The patient will return for reevaluation in 1 year.  We will perform a repeat hearing test at that time.

## 2024-05-02 ENCOUNTER — Ambulatory Visit: Attending: Cardiology

## 2024-05-02 ENCOUNTER — Ambulatory Visit: Attending: Cardiology | Admitting: Cardiology

## 2024-05-02 ENCOUNTER — Encounter: Payer: Self-pay | Admitting: Cardiology

## 2024-05-02 VITALS — BP 158/60 | HR 59 | Ht 63.5 in | Wt 148.2 lb

## 2024-05-02 DIAGNOSIS — I10 Essential (primary) hypertension: Secondary | ICD-10-CM | POA: Diagnosis not present

## 2024-05-02 DIAGNOSIS — R002 Palpitations: Secondary | ICD-10-CM

## 2024-05-02 DIAGNOSIS — R0602 Shortness of breath: Secondary | ICD-10-CM

## 2024-05-02 DIAGNOSIS — E785 Hyperlipidemia, unspecified: Secondary | ICD-10-CM

## 2024-05-02 NOTE — Progress Notes (Unsigned)
 Enrolled patient for a 14 day Zio XT  monitor to be mailed to patients home

## 2024-05-02 NOTE — Patient Instructions (Signed)
 Medication Instructions:  Continue current medications *If you need a refill on your cardiac medications before your next appointment, please call your pharmacy*  Lab Work: Bmet today If you have labs (blood work) drawn today and your tests are completely normal, you will receive your results only by: MyChart Message (if you have MyChart) OR A paper copy in the mail If you have any lab test that is abnormal or we need to change your treatment, we will call you to review the results.  Testing/Procedures: Echo  Your physician has requested that you have an echocardiogram. Echocardiography is a painless test that uses sound waves to create images of your heart. It provides your doctor with information about the size and shape of your heart and how well your heart's chambers and valves are working. This procedure takes approximately one hour. There are no restrictions for this procedure. Please do NOT wear cologne, perfume, aftershave, or lotions (deodorant is allowed). Please arrive 15 minutes prior to your appointment time.  Please note: We ask at that you not bring children with you during ultrasound (echo/ vascular) testing. Due to room size and safety concerns, children are not allowed in the ultrasound rooms during exams. Our front office staff cannot provide observation of children in our lobby area while testing is being conducted. An adult accompanying a patient to their appointment will only be allowed in the ultrasound room at the discretion of the ultrasound technician under special circumstances. We apologize for any inconvenience.     And ZIO  ZIO XT- Long Term Monitor Instructions  Your physician has requested you wear a ZIO patch monitor for 14 days.  This is a single patch monitor. [employer] supplies one patch monitor per enrollment. Additional stickers are not available. Please do not apply patch if you will be having a Nuclear Stress Test,  Echocardiogram, Cardiac CT, MRI,  or Chest Xray during the period you would be wearing the  monitor. The patch cannot be worn during these tests. You cannot remove and re-apply the  ZIO XT patch monitor.  Your ZIO patch monitor will be mailed 3 day USPS to your address on file. It may take 3-5 days  to receive your monitor after you have been enrolled.  Once you have received your monitor, please review the enclosed instructions. Your monitor  has already been registered assigning a specific monitor serial # to you.  Billing and Patient Assistance Program Information  We have supplied [employer] with any of your insurance information on file for billing purposes. [employer] offers a sliding scale Patient Assistance Program for patients that do not have  insurance, or whose insurance does not completely cover the cost of the ZIO monitor.  You must apply for the Patient Assistance Program to qualify for this discounted rate.  To apply, please call [employer] at [phone], select option 4, select option 2, ask to apply for  Patient Assistance Program. Sanna Crystal will ask your household income, and how many people  are in your household. They will quote your out-of-pocket cost based on that information.  [employer] will also be able to set up a 60-month, interest-free payment plan if needed.  Applying the monitor   Shave hair from upper left chest.  Hold abrader disc by orange tab. Rub abrader in 40 strokes over the upper left chest as  indicated in your monitor instructions.  Clean area with 4 enclosed alcohol pads. Let dry.  Apply patch as indicated in monitor instructions. Patch will be placed  under collarbone on left  side of chest with arrow pointing upward.  Rub patch adhesive wings for 2 minutes. Remove white label marked "1". Remove the white  label marked "2". Rub patch adhesive wings for 2 additional minutes.  While looking in a mirror, press and release button in center of patch. A small green light will  flash 3-4 times.  This will be your only indicator that the monitor has been turned on.  Do not shower for the first 24 hours. You may shower after the first 24 hours.  Press the button if you feel a symptom. You will hear a small click. Record Date, Time and  Symptom in the Patient Logbook.  When you are ready to remove the patch, follow instructions on the last 2 pages of Patient  Logbook. Stick patch monitor onto the last page of Patient Logbook.  Place Patient Logbook in the blue and white box. Use locking tab on box and tape box closed  securely. The blue and white box has prepaid postage on it. Please place it in the mailbox as  soon as possible. Your physician should have your test results approximately 7 days after the  monitor has been mailed back to [facility].  Call [facility] Customer Care at [phone] if you have questions regarding  your ZIO XT patch monitor. Call them immediately if you see an orange light blinking on your  monitor.  If your monitor falls off in less than 4 days, contact our Monitor department at [phone].  If your monitor becomes loose or falls off after 4 days call [employer] at [phone] for  suggestions on securing your monitor  Follow-Up: At [facility], you and your health needs are our priority.  As part of our continuing mission to provide you with exceptional heart care, our providers are all part of one team.  This team includes your primary Cardiologist (physician) and Advanced Practice Providers or APPs (Physician Assistants and Nurse Practitioners) who all work together to provide you with the care you need, when you need it. Your next appointment:   3 month(s)  Provider:   Dr. Alda Amas  We recommend signing up for the patient portal called "MyChart".  Sign up information is provided on this After Visit Summary.  MyChart is used to connect with patients for Virtual Visits (Telemedicine).  Patients are able to view lab/test results,  encounter notes, upcoming appointments, etc.  Non-urgent messages can be sent to your provider as well.   To learn more about what you can do with MyChart, go to [email or web address].   Other Instructions PLEASE CHECK BLOOD PRESSURE TWICE A DAY FOR NEXT WEEK AND SEND THOSE READING     Your cardiac CT will be scheduled at one of the below locations:    [facility] [address]  [place], [place] [address] Opening March 27, 2024     If scheduled at the Heart and Vascular Tower at [employer], please enter the parking lot using the [address] street entrance and use the FREE valet service at the patient drop-off area. Enter the buidling and check-in with registration on the main floor.  Please follow these instructions carefully (unless otherwise directed):  An IV will be required for this test and Nitroglycerin will be given.    On the Night Before the Test: Be sure to Drink plenty of water. Do not consume any caffeinated/decaffeinated beverages or chocolate 12 hours prior to your test. Do not take  any antihistamines 12 hours prior to your test. Do not eat any food 1 hour prior to test. You may take your regular medications prior to the test.   If you take Furosemide/Hydrochlorothiazide/Spironolactone/Chlorthalidone, please HOLD on the morning of the test. Patients who wear a continuous glucose monitor MUST remove the device prior to scanning. FEMALES- please wear underwire-free bra if available, avoid dresses & tight clothing   After the Test: Drink plenty of water. After receiving IV contrast, you may experience a mild flushed feeling. This is normal. On occasion, you may experience a mild rash up to 24 hours after the test. This is not dangerous. If this occurs, you can take Benadryl 25 mg, Zyrtec, Claritin, or Allegra and increase your fluid intake. (Patients taking Tikosyn should avoid Benadryl, and may take Zyrtec, Claritin, or  Allegra) If you experience trouble breathing, this can be serious. If it is severe call 911 IMMEDIATELY. If it is mild, please call our office.  We will call to schedule your test 2-4 weeks out understanding that some insurance companies will need an authorization prior to the service being performed.   For more information and frequently asked questions, please visit our website : [email or web address]  For non-scheduling related questions, please contact the cardiac imaging nurse navigator should you have any questions/concerns: Cardiac Imaging Nurse Navigators Direct Office Dial: [phone]   For scheduling needs, including cancellations and rescheduling, please call [place], [phone].

## 2024-05-02 NOTE — Progress Notes (Signed)
 Cardiology Office Note:    Date:  05/02/2024   ID:  Adrain Alar, DOB 1951-08-23, MRN [id number]  PCP:  Ronna Coho, MD  Cardiologist:  None  Electrophysiologist:  None   Referring MD: Ronna Coho, MD   Chief Complaint  Patient presents with   Shortness of Breath    History of Present Illness:    Lauren Martinez is a 73 y.o. female with a hx of hypertension, hyperlipidemia, GERD who is referred by Dr Maryrose Soja for evaluation of dyspnea on exertion.  She reports she has been having shortness of breath for the past year.  States that walking 2 blocks she will feel short of breath.  She denies any chest pain.  Also reports having episodes where feels like heart is racing, will wake her up at night.  Also having episodes of sudden lightheadedness.  Denies any syncope.  Reports BP 130s when checks at home.  She smoked as young adult but none since.  Family history includes father died of MI at 42 and brother had CABG in 73s.   Past Medical History:  Diagnosis Date   GERD (gastroesophageal reflux disease)    History of colonic polyps    HLD (hyperlipidemia)    Hyperlipidemia    Hypertension    Left hip pain    Low back pain    Sciatic leg pain    Spinal stenosis at L4-L5 level    Spondylisthesis    Tinnitus    Urine incontinence    Vaginal dryness    Vitamin D deficiency    WBC decreased     Past Surgical History:  Procedure Laterality Date   ABDOMINAL HYSTERECTOMY  1990   BREAST BIOPSY     BREAST EXCISIONAL BIOPSY Left     Current Medications: Current Meds  Medication Sig   acetaminophen (TYLENOL) 650 MG CR tablet Take 1,300 mg by mouth as needed.   amLODipine (NORVASC) 2.5 MG tablet Take 2.5 mg by mouth daily.   aspirin EC 81 MG tablet Take 81 mg by mouth daily.   calcium carbonate 1250 MG capsule Take 1,250 mg by mouth 2 (two) times daily with a meal.   cholecalciferol (VITAMIN D3) 25 MCG (1000 UNIT) tablet Take 1,000 Units by mouth daily.   losartan (COZAAR) 100 MG  tablet Take 100 mg by mouth daily.   Multiple Vitamins-Minerals (MULTIVITAMIN WITH MINERALS) tablet Take 1 tablet by mouth daily.   Omega-3 Fatty Acids (FISH OIL) 1200 MG CAPS Take 1 capsule by mouth daily.   Oyster Shell Calcium 500 MG TABS Take 1 tablet by mouth daily at 6 (six) AM.   pantoprazole (PROTONIX) 40 MG tablet Take 40 mg by mouth daily.   pravastatin (PRAVACHOL) 40 MG tablet Take 40 mg by mouth daily.     Allergies:   Penicillin g, Penicillins, and Tizanidine hcl   Social History   Socioeconomic History   Marital status: Married    Spouse name: Not on file   Number of children: Not on file   Years of education: Not on file   Highest education level: Not on file  Occupational History   Not on file  Tobacco Use   Smoking status: Former   Smokeless tobacco: Never  Vaping Use   Vaping status: Never Used  Substance and Sexual Activity   Alcohol use: Yes    Comment: daily   Drug use: Never   Sexual activity: Not on file  Other Topics Concern   Not on  file  Social History Narrative   Not on file   Social Drivers of Health   Financial Resource Strain: Not on file  Food Insecurity: Low Risk  (05/05/2023)   Received from [facility]   Hunger Vital Sign    Worried About Running Out of Food in the Last Year: Never true    Ran Out of Food in the Last Year: Never true  Transportation Needs: No Transportation Needs (05/05/2023)   Received from [facility]    In the past 12 months, has lack of reliable transportation kept you from medical appointments, meetings, work or from getting things needed for daily living? : No  Physical Activity: Not on file  Stress: Not on file  Social Connections: Not on file     Family History: The patient's family history includes Breast cancer in her mother.  ROS:   Please see the history of present illness.     All other systems reviewed and are negative.  EKGs/Labs/Other Studies Reviewed:    The following  studies were reviewed today:   EKG:   05/02/2024: Sinus bradycardia, rate 59, no ST abnormalities  Recent Labs: No results found for requested labs within last 365 days.  Recent Lipid Panel No results found for: "CHOL", "TRIG", "HDL", "CHOLHDL", "VLDL", "LDLCALC", "LDLDIRECT"  Physical Exam:    VS:  BP (!) 158/60 (BP Location: Left Arm, Patient Position: Sitting, Cuff Size: Normal)   Pulse (!) 59   Ht 5' 3.5" (1.613 m)   Wt 148 lb 3.2 oz (67.2 kg)   SpO2 95%   BMI 25.84 kg/m     Wt Readings from Last 3 Encounters:  05/02/24 148 lb 3.2 oz (67.2 kg)  04/19/24 149 lb (67.6 kg)  03/06/24 148 lb (67.1 kg)     GEN:  Well nourished, well developed in no acute distress HEENT: Normal NECK: No JVD; No carotid bruits LYMPHATICS: No lymphadenopathy CARDIAC: RRR, no murmurs, rubs, gallops RESPIRATORY:  Clear to auscultation without rales, wheezing or rhonchi  ABDOMEN: Soft, non-tender, non-distended MUSCULOSKELETAL:  No edema; No deformity  SKIN: Warm and dry NEUROLOGIC:  Alert and oriented x 3 PSYCHIATRIC:  Normal affect   ASSESSMENT:    1. SOB (shortness of breath) on exertion   2. Palpitations   3. Essential hypertension   4. Hyperlipidemia, unspecified hyperlipidemia type    PLAN:    Dyspnea on exertion: Reporting dyspnea with minimal exertion, could represent anginal equivalent as does have multiple CAD risk factors (age, family history, hypertension, hyperlipidemia). - Recommend coronary CTA to evaluate for obstructive CAD.  Low resting heart rate, will not give Lopressor prior to study - Echocardiogram to rule out structural heart disease  Palpitations/lightheadedness: Description concerning for arrhythmia, will evaluate with Zio patch x 2 weeks  Hypertension: on amlodipine 2.5 mg daily and losartan 100 mg daily.  BP elevated in clinic today but reports under better control at home.  Asked to check BP twice daily for next week and let us  know results.  Hyperlipidemia:  on pravastatin 40 mg daily.  LDL 96 03/2024.  Previously attempted to increase to 80 mg but did not tolerate.  Will follow-up results of coronary CTA to guide how aggressive to be in lowering cholesterol  RTC in 3 months   Medication Adjustments/Labs and Tests Ordered: Current medicines are reviewed at length with the patient today.  Concerns regarding medicines are outlined above.  Orders Placed This Encounter  Procedures   CT CORONARY MORPH W/CTA  COR W/SCORE W/CA W/CM &/OR WO/CM   Basic Metabolic Panel (BMET)   LONG TERM MONITOR (3-14 DAYS)   EKG 12-Lead   ECHOCARDIOGRAM COMPLETE   No orders of the defined types were placed in this encounter.   Patient Instructions  Medication Instructions:  Continue current medications *If you need a refill on your cardiac medications before your next appointment, please call your pharmacy*  Lab Work: Bmet today If you have labs (blood work) drawn today and your tests are completely normal, you will receive your results only by: MyChart Message (if you have MyChart) OR A paper copy in the mail If you have any lab test that is abnormal or we need to change your treatment, we will call you to review the results.  Testing/Procedures: Echo  Your physician has requested that you have an echocardiogram. Echocardiography is a painless test that uses sound waves to create images of your heart. It provides your doctor with information about the size and shape of your heart and how well your heart's chambers and valves are working. This procedure takes approximately one hour. There are no restrictions for this procedure. Please do NOT wear cologne, perfume, aftershave, or lotions (deodorant is allowed). Please arrive 15 minutes prior to your appointment time.  Please note: We ask at that you not bring children with you during ultrasound (echo/ vascular) testing. Due to room size and safety concerns, children are not allowed in the ultrasound rooms during  exams. Our front office staff cannot provide observation of children in our lobby area while testing is being conducted. An adult accompanying a patient to their appointment will only be allowed in the ultrasound room at the discretion of the ultrasound technician under special circumstances. We apologize for any inconvenience.     And ZIO  ZIO XT- Long Term Monitor Instructions  Your physician has requested you wear a ZIO patch monitor for 14 days.  This is a single patch monitor. [employer] supplies one patch monitor per enrollment. Additional stickers are not available. Please do not apply patch if you will be having a Nuclear Stress Test,  Echocardiogram, Cardiac CT, MRI, or Chest Xray during the period you would be wearing the  monitor. The patch cannot be worn during these tests. You cannot remove and re-apply the  ZIO XT patch monitor.  Your ZIO patch monitor will be mailed 3 day USPS to your address on file. It may take 3-5 days  to receive your monitor after you have been enrolled.  Once you have received your monitor, please review the enclosed instructions. Your monitor  has already been registered assigning a specific monitor serial # to you.  Billing and Patient Assistance Program Information  We have supplied [employer] with any of your insurance information on file for billing purposes. [employer] offers a sliding scale Patient Assistance Program for patients that do not have  insurance, or whose insurance does not completely cover the cost of the ZIO monitor.  You must apply for the Patient Assistance Program to qualify for this discounted rate.  To apply, please call [employer] at [phone], select option 4, select option 2, ask to apply for  Patient Assistance Program. Sanna Crystal will ask your household income, and how many people  are in your household. They will quote your out-of-pocket cost based on that information.  [employer] will also be able to set up a 76-month, interest-free  payment plan if needed.  Applying the monitor   Shave hair from upper left chest.  Hold abrader disc by orange tab. Rub abrader in 40 strokes over the upper left chest as  indicated in your monitor instructions.  Clean area with 4 enclosed alcohol pads. Let dry.  Apply patch as indicated in monitor instructions. Patch will be placed under collarbone on left  side of chest with arrow pointing upward.  Rub patch adhesive wings for 2 minutes. Remove white label marked "1". Remove the white  label marked "2". Rub patch adhesive wings for 2 additional minutes.  While looking in a mirror, press and release button in center of patch. A small green light will  flash 3-4 times. This will be your only indicator that the monitor has been turned on.  Do not shower for the first 24 hours. You may shower after the first 24 hours.  Press the button if you feel a symptom. You will hear a small click. Record Date, Time and  Symptom in the Patient Logbook.  When you are ready to remove the patch, follow instructions on the last 2 pages of Patient  Logbook. Stick patch monitor onto the last page of Patient Logbook.  Place Patient Logbook in the blue and white box. Use locking tab on box and tape box closed  securely. The blue and white box has prepaid postage on it. Please place it in the mailbox as  soon as possible. Your physician should have your test results approximately 7 days after the  monitor has been mailed back to [facility].  Call [facility] Customer Care at [phone] if you have questions regarding  your ZIO XT patch monitor. Call them immediately if you see an orange light blinking on your  monitor.  If your monitor falls off in less than 4 days, contact our Monitor department at [phone].  If your monitor becomes loose or falls off after 4 days call [employer] at [phone] for  suggestions on securing your monitor  Follow-Up: At [facility], you and your health  needs are our priority.  As part of our continuing mission to provide you with exceptional heart care, our providers are all part of one team.  This team includes your primary Cardiologist (physician) and Advanced Practice Providers or APPs (Physician Assistants and Nurse Practitioners) who all work together to provide you with the care you need, when you need it. Your next appointment:   3 month(s)  Provider:   Dr. Alda Amas  We recommend signing up for the patient portal called "MyChart".  Sign up information is provided on this After Visit Summary.  MyChart is used to connect with patients for Virtual Visits (Telemedicine).  Patients are able to view lab/test results, encounter notes, upcoming appointments, etc.  Non-urgent messages can be sent to your provider as well.   To learn more about what you can do with MyChart, go to [email or web address].   Other Instructions PLEASE CHECK BLOOD PRESSURE TWICE A DAY FOR NEXT WEEK AND SEND THOSE READING     Your cardiac CT will be scheduled at one of the below locations:    Jeral[facility] Mom. [facility] [address]  [place], [place] [address] Opening March 27, 2024     If scheduled at the Heart and Vascular Tower at [employer], please enter the parking lot using the [address] street entrance and use the FREE valet service at the patient drop-off area. Enter the buidling and check-in with registration on the main floor.  Please follow these instructions carefully (unless otherwise directed):  An IV  will be required for this test and Nitroglycerin will be given.    On the Night Before the Test: Be sure to Drink plenty of water. Do not consume any caffeinated/decaffeinated beverages or chocolate 12 hours prior to your test. Do not take any antihistamines 12 hours prior to your test. Do not eat any food 1 hour prior to test. You may take your regular medications prior to the test.   If you take  Furosemide/Hydrochlorothiazide/Spironolactone/Chlorthalidone, please HOLD on the morning of the test. Patients who wear a continuous glucose monitor MUST remove the device prior to scanning. FEMALES- please wear underwire-free bra if available, avoid dresses & tight clothing   After the Test: Drink plenty of water. After receiving IV contrast, you may experience a mild flushed feeling. This is normal. On occasion, you may experience a mild rash up to 24 hours after the test. This is not dangerous. If this occurs, you can take Benadryl 25 mg, Zyrtec, Claritin, or Allegra and increase your fluid intake. (Patients taking Tikosyn should avoid Benadryl, and may take Zyrtec, Claritin, or Allegra) If you experience trouble breathing, this can be serious. If it is severe call 911 IMMEDIATELY. If it is mild, please call our office.  We will call to schedule your test 2-4 weeks out understanding that some insurance companies will need an authorization prior to the service being performed.   For more information and frequently asked questions, please visit our website : [email or web address]  For non-scheduling related questions, please contact the cardiac imaging nurse navigator should you have any questions/concerns: Cardiac Imaging Nurse Navigators Direct Office Dial: [phone]   For scheduling needs, including cancellations and rescheduling, please call [place], [phone].        Signed, Wendie Hamburg, MD  05/02/2024 6:47 PM    [facility]

## 2024-05-03 ENCOUNTER — Ambulatory Visit: Payer: Self-pay | Admitting: Cardiology

## 2024-05-03 DIAGNOSIS — E785 Hyperlipidemia, unspecified: Secondary | ICD-10-CM

## 2024-05-03 LAB — BASIC METABOLIC PANEL WITH GFR
BUN/Creatinine Ratio: 17 (ref 12–28)
BUN: 15 mg/dL (ref 8–27)
CO2: 24 mmol/L (ref 20–29)
Calcium: 10.6 mg/dL — ABNORMAL HIGH (ref 8.7–10.3)
Chloride: 101 mmol/L (ref 96–106)
Creatinine, Ser: 0.9 mg/dL (ref 0.57–1.00)
Glucose: 92 mg/dL (ref 70–99)
Potassium: 4.2 mmol/L (ref 3.5–5.2)
Sodium: 140 mmol/L (ref 134–144)
eGFR: 67 mL/min/{1.73_m2} (ref >59)

## 2024-05-11 ENCOUNTER — Encounter (HOSPITAL_COMMUNITY): Payer: Self-pay

## 2024-05-15 ENCOUNTER — Telehealth: Payer: Self-pay | Admitting: Cardiology

## 2024-05-15 ENCOUNTER — Ambulatory Visit (HOSPITAL_COMMUNITY)
Admission: RE | Admit: 2024-05-15 | Discharge: 2024-05-15 | Disposition: A | Discharge status: Home / Self Care | Source: Ambulatory Visit | Attending: Cardiology | Admitting: Cardiology

## 2024-05-15 DIAGNOSIS — I2510 Atherosclerotic heart disease of native coronary artery without angina pectoris: Secondary | ICD-10-CM | POA: Diagnosis not present

## 2024-05-15 DIAGNOSIS — I2582 Chronic total occlusion of coronary artery: Secondary | ICD-10-CM | POA: Diagnosis not present

## 2024-05-15 DIAGNOSIS — R0602 Shortness of breath: Secondary | ICD-10-CM | POA: Insufficient documentation

## 2024-05-15 MED ORDER — IOHEXOL 350 MG/ML SOLN
100.0000 mL | Freq: Once | INTRAVENOUS | Status: AC | PRN
  Administered 2024-05-15: 100 mL via INTRAVENOUS

## 2024-05-15 MED ORDER — NITROGLYCERIN 0.4 MG SL SUBL
SUBLINGUAL_TABLET | SUBLINGUAL | Status: AC
  Filled 2024-05-15: qty 23

## 2024-05-15 MED ORDER — NITROGLYCERIN 0.4 MG SL SUBL
0.8000 mg | SUBLINGUAL_TABLET | Freq: Once | SUBLINGUAL | Status: AC
Start: 2024-05-15 — End: 2024-05-15
  Administered 2024-05-15: 0.8 mg via SUBLINGUAL

## 2024-05-15 NOTE — Telephone Encounter (Signed)
 Patient dropped off BP readings to give Dr. Alda Amas 05-15-2024 will be in his box by the end of the day! Thank you

## 2024-05-16 MED ORDER — ROSUVASTATIN CALCIUM 10 MG PO TABS
10.0000 mg | ORAL_TABLET | Freq: Every day | ORAL | 12 refills | Status: DC
Start: 2024-05-16 — End: 2024-08-07

## 2024-05-16 NOTE — Telephone Encounter (Signed)
 Called and made patient aware of results and to stop Pravastatin and to start Rosuvastatin 10 mg daily. Prescription sent to patient pharmacy. Made patient aware Lipid panel in 2 months.  Understanding verbalized

## 2024-05-22 NOTE — Telephone Encounter (Signed)
 BP log reviewed, BP has been elevated.  Recommend increasing amlodipine to 5mg  daily

## 2024-05-23 ENCOUNTER — Other Ambulatory Visit: Payer: Self-pay | Admitting: *Deleted

## 2024-05-23 MED ORDER — AMLODIPINE BESYLATE 5 MG PO TABS: 5.0000 mg | ORAL_TABLET | Freq: Every day | ORAL | 3 refills | Status: AC

## 2024-05-23 NOTE — Progress Notes (Signed)
 Called and made patient aware per Dr. Kate to increase Amlodipine 5 mg daily. Understanding verbalized.

## 2024-06-01 DIAGNOSIS — R002 Palpitations: Secondary | ICD-10-CM | POA: Diagnosis not present

## 2024-06-05 ENCOUNTER — Other Ambulatory Visit: Payer: Self-pay | Admitting: *Deleted

## 2024-06-05 ENCOUNTER — Encounter: Payer: Self-pay | Admitting: *Deleted

## 2024-06-05 DIAGNOSIS — R918 Other nonspecific abnormal finding of lung field: Secondary | ICD-10-CM

## 2024-06-07 ENCOUNTER — Ambulatory Visit (HOSPITAL_COMMUNITY)
Admission: RE | Admit: 2024-06-07 | Discharge: 2024-06-07 | Disposition: A | Discharge status: Home / Self Care | Source: Ambulatory Visit | Attending: Cardiovascular Disease | Admitting: Cardiovascular Disease

## 2024-06-07 DIAGNOSIS — I517 Cardiomegaly: Secondary | ICD-10-CM | POA: Diagnosis not present

## 2024-06-07 DIAGNOSIS — I77810 Thoracic aortic ectasia: Secondary | ICD-10-CM | POA: Diagnosis not present

## 2024-06-07 DIAGNOSIS — I088 Other rheumatic multiple valve diseases: Secondary | ICD-10-CM

## 2024-06-07 DIAGNOSIS — R0602 Shortness of breath: Secondary | ICD-10-CM | POA: Diagnosis not present

## 2024-06-07 DIAGNOSIS — I5030 Unspecified diastolic (congestive) heart failure: Secondary | ICD-10-CM

## 2024-06-07 LAB — ECHOCARDIOGRAM COMPLETE
Area-P 1/2: 4.06 cm2
S' Lateral: 3.15 cm

## 2024-06-24 DIAGNOSIS — R002 Palpitations: Secondary | ICD-10-CM

## 2024-07-14 ENCOUNTER — Other Ambulatory Visit: Payer: Self-pay | Admitting: Emergency Medicine

## 2024-07-14 DIAGNOSIS — E785 Hyperlipidemia, unspecified: Secondary | ICD-10-CM | POA: Diagnosis not present

## 2024-07-15 LAB — LIPID PANEL
Chol/HDL Ratio: 3.1 ratio (ref 0.0–4.4)
Cholesterol, Total: 146 mg/dL (ref 100–199)
HDL: 47 mg/dL (ref >39)
LDL Chol Calc (NIH): 71 mg/dL (ref 0–99)
Triglycerides: 163 mg/dL — ABNORMAL HIGH (ref 0–149)
VLDL Cholesterol Cal: 28 mg/dL (ref 5–40)

## 2024-07-16 ENCOUNTER — Ambulatory Visit: Payer: Self-pay | Admitting: Cardiology

## 2024-08-06 NOTE — Progress Notes (Unsigned)
 Cardiology Office Note:    Date:  08/07/2024   ID:  Lauren Martinez, DOB [birth year], MRN [id number]  PCP:  Kip Righter, MD  Cardiologist:  None  Electrophysiologist:  None   Referring MD: Kip Righter, MD   Chief Complaint  Patient presents with   Coronary Artery Disease    History of Present Illness:    Lauren Martinez is a 73 y.o. female with a hx of CAD, hypertension, hyperlipidemia, GERD who presents for follow-up.  She was referred by Dr Kip for evaluation of dyspnea on exertion, initially seen 05/02/2024.  She reports she has been having shortness of breath for the past year.  States that walking 2 blocks she will feel short of breath.  She denies any chest pain.  Also reports having episodes where feels like heart is racing, will wake her up at night.  Also having episodes of sudden lightheadedness.  Denies any syncope.  Reports BP 130s when checks at home.  She smoked as young adult but none since.  Family history includes father died of MI at 31 and brother had CABG in 33s.  Coronary CTA 04/2024 showed mild stenosis in proximal and distal LAD and proximal LCx, calcium  score 451 (85th percentile); also noted to have bilateral pulmonary nodules measuring up to 5 mm.  Echocardiogram 05/2024 showed EF 60 to 65%, G1 DD, normal RV function, moderate aortic regurgitation, mild dilatation of aorta measuring 40 mm.  Zio patch x 14 days 04/2024 showed 13 episodes of SVT with longest lasting 11 seconds, patient triggered events corresponded to sinus rhythm.    Since last clinic visit, she reports she is doing okay.  Continues to report intermittent chest pain and dyspnea.  Denies any lightheadedness or syncope.  Reports BP 130s over 60s when checks at home.  Reports having myalgias, particularly when she lies down at night.  Past Medical History:  Diagnosis Date   GERD (gastroesophageal reflux disease)    History of colonic polyps    HLD (hyperlipidemia)    Hyperlipidemia    Hypertension     Left hip pain    Low back pain    Sciatic leg pain    Spinal stenosis at L4-L5 level    Spondylisthesis    Tinnitus    Urine incontinence    Vaginal dryness    Vitamin D deficiency    WBC decreased     Past Surgical History:  Procedure Laterality Date   ABDOMINAL HYSTERECTOMY  1990   BREAST BIOPSY     BREAST EXCISIONAL BIOPSY Left     Current Medications: Current Meds  Medication Sig   acetaminophen (TYLENOL) 650 MG CR tablet Take 1,300 mg by mouth as needed.   amLODipine  (NORVASC ) 5 MG tablet Take 1 tablet (5 mg total) by mouth daily.   aspirin EC 81 MG tablet Take 81 mg by mouth daily.   atorvastatin  (LIPITOR) 40 MG tablet Take 1 tablet (40 mg total) by mouth daily.   calcium  carbonate 1250 MG capsule Take 1,250 mg by mouth 2 (two) times daily with a meal.   cholecalciferol (VITAMIN D3) 25 MCG (1000 UNIT) tablet Take 1,000 Units by mouth daily.   losartan (COZAAR) 50 MG tablet Take 100 mg by mouth daily.    Multiple Vitamin (MULTI-VITAMIN) tablet Take 2 tablets by mouth daily.   Multiple Vitamins-Minerals (MULTIVITAMIN WITH MINERALS) tablet Take 1 tablet by mouth daily.   mupirocin ointment (BACTROBAN) 2 % Apply 1 Application topically as needed.  Omega-3 Fatty Acids (FISH OIL) 1200 MG CAPS Take 1 capsule by mouth daily.   pantoprazole (PROTONIX) 40 MG tablet Take 40 mg by mouth daily.   polyethylene glycol (MIRALAX / GLYCOLAX) 17 g packet Take 17 g by mouth daily as needed for mild constipation.   [DISCONTINUED] rosuvastatin  (CRESTOR ) 10 MG tablet Take 1 tablet (10 mg total) by mouth daily.     Allergies:   Penicillin g, Penicillins, and Tizanidine hcl   Social History   Socioeconomic History   Marital status: Married    Spouse name: Not on file   Number of children: Not on file   Years of education: Not on file   Highest education level: Not on file  Occupational History   Not on file  Tobacco Use   Smoking status: Former   Smokeless tobacco: Never  Vaping  Use   Vaping status: Never Used  Substance and Sexual Activity   Alcohol use: Yes    Comment: daily   Drug use: Never   Sexual activity: Not on file  Other Topics Concern   Not on file  Social History Narrative   Not on file   Social Drivers of Health   Financial Resource Strain: Not on file  Food Insecurity: Low Risk  (05/05/2023)   Received from [facility]   Hunger Vital Sign    Within the past 12 months, you worried that your food would run out before you got money to buy more: Never true    Within the past 12 months, the food you bought just didn't last and you didn't have money to get more. : Never true  Transportation Needs: No Transportation Needs (05/05/2023)   Received from [facility]    In the past 12 months, has lack of reliable transportation kept you from medical appointments, meetings, work or from getting things needed for daily living? : No  Physical Activity: Not on file  Stress: Not on file  Social Connections: Not on file     Family History: The patient's family history includes Breast cancer in her mother.  ROS:   Please see the history of present illness.     All other systems reviewed and are negative.  EKGs/Labs/Other Studies Reviewed:    The following studies were reviewed today:   EKG:   05/02/2024: Sinus bradycardia, rate 59, no ST abnormalities  Recent Labs: 05/02/2024: BUN 15; Creatinine, Ser 0.90; Potassium 4.2; Sodium 140  Recent Lipid Panel    Component Value Date/Time   CHOL 146 07/14/2024 0847   TRIG 163 (H) 07/14/2024 0847   HDL 47 07/14/2024 0847   CHOLHDL 3.1 07/14/2024 0847   LDLCALC 71 07/14/2024 0847    Physical Exam:    VS:  BP (!) 142/56 (BP Location: Right Arm, Patient Position: Sitting, Cuff Size: Normal)   Pulse 78   Ht 5' 3.5 (1.613 m)   Wt 151 lb (68.5 kg)   SpO2 98%   BMI 26.33 kg/m     Wt Readings from Last 3 Encounters:  08/07/24 151 lb (68.5 kg)  05/02/24 148 lb 3.2 oz (67.2 kg)   04/19/24 149 lb (67.6 kg)     GEN:  Well nourished, well developed in no acute distress HEENT: Normal NECK: No JVD; No carotid bruits LYMPHATICS: No lymphadenopathy CARDIAC: RRR, no murmurs, rubs, gallops RESPIRATORY:  Clear to auscultation without rales, wheezing or rhonchi  ABDOMEN: Soft, non-tender, non-distended MUSCULOSKELETAL:  No edema; No deformity  SKIN: Warm and  dry NEUROLOGIC:  Alert and oriented x 3 PSYCHIATRIC:  Normal affect   ASSESSMENT:    1. Coronary artery disease involving native coronary artery of native heart without angina pectoris   2. Aortic valve insufficiency, etiology of cardiac valve disease unspecified   3. Essential hypertension   4. Hyperlipidemia, unspecified hyperlipidemia type   5. Lung nodules   6. Ascending aorta dilatation (HCC)     PLAN:    CAD: Reporting dyspnea with minimal exertion. Coronary CTA 04/2024 showed mild stenosis in proximal and distal LAD and proximal LCx, calcium  score 451 (85th percentile); also noted to have bilateral pulmonary nodules measuring up to 5 mm.  Echocardiogram 05/2024 showed EF 60 to 65%, G1DD, normal RV function, moderate aortic regurgitation, mild dilatation of aorta measuring 40 mm.   - Continue aspirin 81 mg daily - Reporting myalgias on rosuvastatin , will discontinue.  Will trial atorvastatin  40 mg daily.  If having myalgias on atorvastatin , would recommend referral to pharmacy lipid clinic to evaluate for PCSK9 inhibitor  Aortic regurgitation: Moderate on echocardiogram 05/2024.  Plan repeat echocardiogram in 1 year to monitor  Dilated aorta: Mild dilatation of aorta measuring 40 mm on CTA 04/2024.  Plan echocardiogram in 1 year to monitor  Palpitations/lightheadedness: Zio patch x 14 days 04/2024 showed 13 episodes of SVT with longest lasting 11 seconds, patient triggered events corresponded to sinus rhythm.  Hypertension: on amlodipine  5 mg daily and losartan 100 mg daily.  BP mildly elevated in clinic  today.  Recommend checking BP twice daily for next week and let us  know results  Hyperlipidemia: on rosuvastatin  10 mg daily, LDL 71 on 07/14/2024.  Having myalgias on rosuvastatin , will switch to atorvastatin  as above.  Lung nodules: Small bilateral pulmonary nodules noted on coronary CTA 04/2024.  Plan noncontrast chest CT in 1 year to follow  RTC in 6 months   Medication Adjustments/Labs and Tests Ordered: Current medicines are reviewed at length with the patient today.  Concerns regarding medicines are outlined above.  Orders Placed This Encounter  Procedures   ECHOCARDIOGRAM COMPLETE   Meds ordered this encounter  Medications   atorvastatin  (LIPITOR) 40 MG tablet    Sig: Take 1 tablet (40 mg total) by mouth daily.    Dispense:  30 tablet    Refill:  0    Patient Instructions  Medication Instructions:  Start Atorvastatin  40 mg as discussed with provider Stop Rosuvastatin  as discussed with your provider *If you need a refill on your cardiac medications before your next appointment, please call your pharmacy*  Lab Work: none If you have labs (blood work) drawn today and your tests are completely normal, you will receive your results only by: MyChart Message (if you have MyChart) OR A paper copy in the mail If you have any lab test that is abnormal or we need to change your treatment, we will call you to review the results.  Testing/Procedures: Echo  in one year Your physician has requested that you have an echocardiogram. Echocardiography is a painless test that uses sound waves to create images of your heart. It provides your doctor with information about the size and shape of your heart and how well your heart's chambers and valves are working. This procedure takes approximately one hour. There are no restrictions for this procedure. Please do NOT wear cologne, perfume, aftershave, or lotions (deodorant is allowed). Please arrive 15 minutes prior to your appointment  time.  Please note: We ask at that you not bring children  with you during ultrasound (echo/ vascular) testing. Due to room size and safety concerns, children are not allowed in the ultrasound rooms during exams. Our front office staff cannot provide observation of children in our lobby area while testing is being conducted. An adult accompanying a patient to their appointment will only be allowed in the ultrasound room at the discretion of the ultrasound technician under special circumstances. We apologize for any inconvenience.   Follow-Up: At [facility], you and your health needs are our priority.  As part of our continuing mission to provide you with exceptional heart care, our providers are all part of one team.  This team includes your primary Cardiologist (physician) and Advanced Practice Providers or APPs (Physician Assistants and Nurse Practitioners) who all work together to provide you with the care you need, when you need it.  Your next appointment:   6 month  Provider:    We recommend signing up for the patient portal called MyChart.  Sign up information is provided on this After Visit Summary.  MyChart is used to connect with patients for Virtual Visits (Telemedicine).  Patients are able to view lab/test results, encounter notes, upcoming appointments, etc.  Non-urgent messages can be sent to your provider as well.   To learn more about what you can do with MyChart, go to [email or web address].   Other Instructions Please let us  know if Atorvastatin  works       Signed, Lonni LITTIE Nanas, MD  08/07/2024 10:43 AM    [facility]

## 2024-08-07 ENCOUNTER — Ambulatory Visit: Attending: Cardiology | Admitting: Cardiology

## 2024-08-07 VITALS — BP 142/56 | HR 78 | Ht 63.5 in | Wt 151.0 lb

## 2024-08-07 DIAGNOSIS — I10 Essential (primary) hypertension: Secondary | ICD-10-CM

## 2024-08-07 DIAGNOSIS — E785 Hyperlipidemia, unspecified: Secondary | ICD-10-CM | POA: Diagnosis not present

## 2024-08-07 DIAGNOSIS — I351 Nonrheumatic aortic (valve) insufficiency: Secondary | ICD-10-CM | POA: Diagnosis not present

## 2024-08-07 DIAGNOSIS — R918 Other nonspecific abnormal finding of lung field: Secondary | ICD-10-CM

## 2024-08-07 DIAGNOSIS — I77810 Thoracic aortic ectasia: Secondary | ICD-10-CM | POA: Diagnosis not present

## 2024-08-07 DIAGNOSIS — I2510 Atherosclerotic heart disease of native coronary artery without angina pectoris: Secondary | ICD-10-CM

## 2024-08-07 MED ORDER — ATORVASTATIN CALCIUM 40 MG PO TABS: 40.0000 mg | ORAL_TABLET | Freq: Every day | ORAL | 0 refills | Status: DC

## 2024-08-07 NOTE — Patient Instructions (Addendum)
 Medication Instructions:  Start Atorvastatin  40 mg as discussed with provider Stop Rosuvastatin  as discussed with your provider *If you need a refill on your cardiac medications before your next appointment, please call your pharmacy*  Lab Work: none If you have labs (blood work) drawn today and your tests are completely normal, you will receive your results only by: MyChart Message (if you have MyChart) OR A paper copy in the mail If you have any lab test that is abnormal or we need to change your treatment, we will call you to review the results.  Testing/Procedures: Echo  in one year Your physician has requested that you have an echocardiogram. Echocardiography is a painless test that uses sound waves to create images of your heart. It provides your doctor with information about the size and shape of your heart and how well your heart's chambers and valves are working. This procedure takes approximately one hour. There are no restrictions for this procedure. Please do NOT wear cologne, perfume, aftershave, or lotions (deodorant is allowed). Please arrive 15 minutes prior to your appointment time.  Please note: We ask at that you not bring children with you during ultrasound (echo/ vascular) testing. Due to room size and safety concerns, children are not allowed in the ultrasound rooms during exams. Our front office staff cannot provide observation of children in our lobby area while testing is being conducted. An adult accompanying a patient to their appointment will only be allowed in the ultrasound room at the discretion of the ultrasound technician under special circumstances. We apologize for any inconvenience.   Follow-Up: At [facility], you and your health needs are our priority.  As part of our continuing mission to provide you with exceptional heart care, our providers are all part of one team.  This team includes your primary Cardiologist (physician) and Advanced Practice  Providers or APPs (Physician Assistants and Nurse Practitioners) who all work together to provide you with the care you need, when you need it.  Your next appointment:   6 month  Provider:    We recommend signing up for the patient portal called MyChart.  Sign up information is provided on this After Visit Summary.  MyChart is used to connect with patients for Virtual Visits (Telemedicine).  Patients are able to view lab/test results, encounter notes, upcoming appointments, etc.  Non-urgent messages can be sent to your provider as well.   To learn more about what you can do with MyChart, go to [email or web address].   Other Instructions Please let us  know if Atorvastatin  works

## 2024-08-16 ENCOUNTER — Encounter: Payer: Self-pay | Admitting: Cardiology

## 2024-08-18 NOTE — Telephone Encounter (Signed)
 Systolic BP intermittently elevated but diastolic BP is good/borderline low, would not recommend any changes at this time

## 2024-08-29 ENCOUNTER — Other Ambulatory Visit: Payer: Self-pay | Admitting: Cardiology

## 2024-08-29 DIAGNOSIS — I351 Nonrheumatic aortic (valve) insufficiency: Secondary | ICD-10-CM

## 2024-11-03 DIAGNOSIS — J019 Acute sinusitis, unspecified: Secondary | ICD-10-CM | POA: Diagnosis not present

## 2024-11-03 DIAGNOSIS — R051 Acute cough: Secondary | ICD-10-CM | POA: Diagnosis not present

## 2025-03-23 ENCOUNTER — Ambulatory Visit: Admitting: Cardiology

## 2025-03-27 ENCOUNTER — Ambulatory Visit (HOSPITAL_BASED_OUTPATIENT_CLINIC_OR_DEPARTMENT_OTHER): Admitting: Cardiology

## 2025-08-07 ENCOUNTER — Other Ambulatory Visit (HOSPITAL_COMMUNITY)
